# Patient Record
Sex: Male | Born: 1990 | Race: Black or African American | Hispanic: No | Marital: Single | Smoking: Never smoker
Health system: Southern US, Community
[De-identification: ages and names within clinical notes are randomized; demographics above are authoritative.]

## PROBLEM LIST (undated history)

## (undated) DIAGNOSIS — F32A Depression, unspecified: Secondary | ICD-10-CM

## (undated) DIAGNOSIS — F419 Anxiety disorder, unspecified: Secondary | ICD-10-CM

## (undated) HISTORY — DX: Depression, unspecified: F32.A

## (undated) HISTORY — DX: Anxiety disorder, unspecified: F41.9

## (undated) HISTORY — PX: WISDOM TOOTH EXTRACTION: SHX21

---

## 2013-08-11 ENCOUNTER — Encounter (HOSPITAL_COMMUNITY): Payer: Self-pay | Admitting: Emergency Medicine

## 2013-08-11 ENCOUNTER — Other Ambulatory Visit (HOSPITAL_COMMUNITY)
Admission: RE | Admit: 2013-08-11 | Discharge: 2013-08-11 | Disposition: A | Discharge status: Home / Self Care | Payer: Managed Care, Other (non HMO) | Source: Ambulatory Visit | Attending: Family Medicine | Admitting: Family Medicine

## 2013-08-11 ENCOUNTER — Emergency Department (INDEPENDENT_AMBULATORY_CARE_PROVIDER_SITE_OTHER)
Admission: EM | Admit: 2013-08-11 | Discharge: 2013-08-11 | Disposition: A | Discharge status: Home / Self Care | Payer: Managed Care, Other (non HMO) | Source: Home / Self Care | Attending: Family Medicine | Admitting: Family Medicine

## 2013-08-11 DIAGNOSIS — Z113 Encounter for screening for infections with a predominantly sexual mode of transmission: Secondary | ICD-10-CM | POA: Insufficient documentation

## 2013-08-11 DIAGNOSIS — J029 Acute pharyngitis, unspecified: Secondary | ICD-10-CM

## 2013-08-11 LAB — RPR

## 2013-08-11 LAB — HIV ANTIBODY (ROUTINE TESTING W REFLEX): HIV: NONREACTIVE

## 2013-08-11 LAB — POCT RAPID STREP A: STREPTOCOCCUS, GROUP A SCREEN (DIRECT): NEGATIVE

## 2013-08-11 MED ORDER — PREDNISONE 10 MG PO TABS: 30.0000 mg | ORAL_TABLET | Freq: Every day | ORAL | Status: DC

## 2013-08-11 MED ORDER — IPRATROPIUM BROMIDE 0.06 % NA SOLN: 2.0000 | Freq: Four times a day (QID) | NASAL | Status: DC

## 2013-08-11 NOTE — Discharge Instructions (Signed)
Thank you for coming in today. Take prednisone daily for 5 days. Use nasal spray to reduce postnasal drip. Call or go to the emergency room if you get worse, have trouble breathing, have chest pains, or palpitations.   Pharyngitis Pharyngitis is redness, pain, and swelling (inflammation) of your pharynx.  CAUSES  Pharyngitis is usually caused by infection. Most of the time, these infections are from viruses (viral) and are part of a cold. However, sometimes pharyngitis is caused by bacteria (bacterial). Pharyngitis can also be caused by allergies. Viral pharyngitis may be spread from person to person by coughing, sneezing, and personal items or utensils (cups, forks, spoons, toothbrushes). Bacterial pharyngitis may be spread from person to person by more intimate contact, such as kissing.  SIGNS AND SYMPTOMS  Symptoms of pharyngitis include:   Sore throat.   Tiredness (fatigue).   Low-grade fever.   Headache.  Joint pain and muscle aches.  Skin rashes.  Swollen lymph nodes.  Plaque-like film on throat or tonsils (often seen with bacterial pharyngitis). DIAGNOSIS  Your health care provider will ask you questions about your illness and your symptoms. Your medical history, along with a physical exam, is often all that is needed to diagnose pharyngitis. Sometimes, a rapid strep test is done. Other lab tests may also be done, depending on the suspected cause.  TREATMENT  Viral pharyngitis will usually get better in 3-4 days without the use of medicine. Bacterial pharyngitis is treated with medicines that kill germs (antibiotics).  HOME CARE INSTRUCTIONS   Drink enough water and fluids to keep your urine clear or pale yellow.   Only take over-the-counter or prescription medicines as directed by your health care provider:   If you are prescribed antibiotics, make sure you finish them even if you start to feel better.   Do not take aspirin.   Get lots of rest.   Gargle  with 8 oz of salt water ( tsp of salt per 1 qt of water) as often as every 1-2 hours to soothe your throat.   Throat lozenges (if you are not at risk for choking) or sprays may be used to soothe your throat. SEEK MEDICAL CARE IF:   You have large, tender lumps in your neck.  You have a rash.  You cough up green, yellow-brown, or bloody spit. SEEK IMMEDIATE MEDICAL CARE IF:   Your neck becomes stiff.  You drool or are unable to swallow liquids.  You vomit or are unable to keep medicines or liquids down.  You have severe pain that does not go away with the use of recommended medicines.  You have trouble breathing (not caused by a stuffy nose). MAKE SURE YOU:   Understand these instructions.  Will watch your condition.  Will get help right away if you are not doing well or get worse. Document Released: 01/29/2005 Document Revised: 11/19/2012 Document Reviewed: 10/06/2012 [facility] Patient Information 2015 [place], [place]. This information is not intended to replace advice given to you by your health care provider. Make sure you discuss any questions you have with your health care provider.

## 2013-08-11 NOTE — ED Notes (Signed)
C/o  Sore throat since Sunday.  Post nasal drip.  Swelling of left side of neck.  Denies fever.  Mild relief with ibuprofen.

## 2013-08-11 NOTE — ED Provider Notes (Signed)
[name] is a 23 y.o. male who presents to Urgent Care today for sore throat. Patient has a two-day history of sore throat with postnasal drip. Denies any cough congestion nausea vomiting or diarrhea. Ibuprofen does help a bit. No fevers or chills chest pains or palpitations. The pain is mild.   History reviewed. No pertinent past medical history. History  Substance Use Topics  . Smoking status: Never Smoker   . Smokeless tobacco: Not on file  . Alcohol Use: Yes   ROS as above Medications: No current facility-administered medications for this encounter.   Current Outpatient Prescriptions  Medication Sig Dispense Refill  . ipratropium (ATROVENT) 0.06 % nasal spray Place 2 sprays into both nostrils 4 (four) times daily.  15 mL  1  . predniSONE (DELTASONE) 10 MG tablet Take 3 tablets (30 mg total) by mouth daily.  15 tablet  0    Exam:  BP 153/83  Pulse 52  Temp(Src) 98.1 F (36.7 C) (Oral)  Resp 16  SpO2 100% Gen: Well NAD HEENT: EOMI,  MMM posterior pharynx is mildly erythematous. There is cobblestoning as well. Tympanic membranes are occluded by cerumen bilaterally. Lungs: Normal work of breathing. CTABL Heart: RRR no MRG Abd: NABS, Soft. NT, ND Exts: Brisk capillary refill, warm and well perfused.   Results for orders placed during the hospital encounter of 08/11/13 (from the past 24 hour(s))  POCT RAPID STREP A ([facility] ONLY)     Status: None   Collection Time    08/11/13 11:17 AM      Result Value Ref Range   Streptococcus, Group A Screen (Direct) NEGATIVE  NEGATIVE   No results found.  Assessment and Plan: 23 y.o. male with pharyngitis. Plan she was prednisone and Atrovent nasal spray. Throat culture pending.  Discussed warning signs or symptoms. Please see discharge instructions. Patient expresses understanding.    Rodolph BongEvan S Corey, MD 08/11/13 1145

## 2013-08-13 LAB — CULTURE, GROUP A STREP

## 2013-10-14 ENCOUNTER — Ambulatory Visit (INDEPENDENT_AMBULATORY_CARE_PROVIDER_SITE_OTHER): Payer: Managed Care, Other (non HMO) | Admitting: Family Medicine

## 2013-10-14 VITALS — BP 130/84 | HR 51 | Temp 98.0°F | Resp 18 | Ht 71.5 in | Wt 209.8 lb

## 2013-10-14 DIAGNOSIS — S61209A Unspecified open wound of unspecified finger without damage to nail, initial encounter: Secondary | ICD-10-CM

## 2013-10-14 DIAGNOSIS — S61219A Laceration without foreign body of unspecified finger without damage to nail, initial encounter: Secondary | ICD-10-CM

## 2013-10-14 DIAGNOSIS — M79645 Pain in left finger(s): Secondary | ICD-10-CM

## 2013-10-14 DIAGNOSIS — M79609 Pain in unspecified limb: Secondary | ICD-10-CM

## 2013-10-14 NOTE — Progress Notes (Signed)
[facility] [address], [place] [address] [phone]  Date:  10/14/2013   Name:  [name]   DOB:  01-22-1991   MRN:  [id number]  PCP:  No PCP Per Patient    Chief Complaint: Laceration   History of Present Illness:  [name] is a 23 y.o. very pleasant male patient who presents with the following:  He cut his left index finger on a can last night around 7:30 pm.  He is right handed.  He is generally healthy.  The wound is still open and bleeding some- he would like to have it repaired.  Last tetanus was in 2011.   No allergies No other issues today  There are no active problems to display for this patient.   History reviewed. No pertinent past medical history.  History reviewed. No pertinent past surgical history.  History  Substance Use Topics  . Smoking status: Never Smoker   . Smokeless tobacco: Not on file  . Alcohol Use: Yes    Family History  Problem Relation Age of Onset  . Hypertension Father     No Known Allergies  Medication list has been reviewed and updated.  No current outpatient prescriptions on file prior to visit.   No current facility-administered medications on file prior to visit.    Review of Systems:  As per HPI- otherwise negative.   Physical Examination: Filed Vitals:   10/14/13 1035  BP: 130/84  Pulse: 51  Temp: 98 F (36.7 C)  Resp: 18   Filed Vitals:   10/14/13 1035  Height: 5' 11.5" (1.816 m)  Weight: 209 lb 12.8 oz (95.165 kg)   Body mass index is 28.86 kg/(m^2). Ideal Body Weight: Weight in (lb) to have BMI = 25: 181.4  GEN: WDWN, NAD, Non-toxic, A & O x 3, looks well HEENT: Atraumatic, Normocephalic. Neck supple. No masses, No LAD. Ears and Nose: No external deformity. CV: RRR, No M/G/R. No JVD. No thrill. No extra heart sounds. PULM: CTA B, no wheezes, crackles, rhonchi. No retractions. No resp. distress. No accessory muscle use. EXTR: No c/c/e NEURO Normal gait.  PSYCH:  Normally interactive. Conversant. Not depressed or anxious appearing.  Calm demeanor.  Left hand: he has a laceration across the pad of the left index finger, distal phalanx.  It does not appear to be deep but is gaping open.  He has normal sensation distal to the laceration and normal flexion/ extension/ ab and adduction of the finger.  Nothing to suggest tendon damage   Reviewed database- he had a Tdap in 07/2009  See laceration repair note per Drucie Ip, PA-C Discussed with pt the fact that this closure is later than ideal as it have been over 12 hours. He understands this but would like to repair the wound as it is bleeding and a bother. However he will watch closely for any sign of infection- reviewed with him in detail.  He would like to proceed with repair   Assessment and Plan: Pain in finger of left hand  Laceration of finger, initial encounter  Follow-up for SR in 7-10 days.  Watch for any sign of infection.  Wound care discussed  Signed Abbe Amsterdam, MD

## 2013-10-14 NOTE — Patient Instructions (Signed)

## 2013-10-14 NOTE — Progress Notes (Signed)
Patient ID: [name] MRN: [id number], DOB: 26-Sep-1990, 23 y.o. Date of Encounter: 10/14/2013, 11:46 AM   PROCEDURE NOTE: Verbal consent obtained. Sterile technique employed. Numbing: Anesthesia obtained with 2% lidocaine plain.   Cleansed with soap and water. Irrigated.  Wound explored, no deep structures involved, no foreign bodies.   Wound repaired with # 7 SI sutures.  Hemostasis obtained. Wound cleansed and dressed.  Wound care instructions including precautions covered with patient. Handout given.  Anticipate suture removal in 7-10 days  Rhoderick Moody, PA-C 10/14/2013 11:46 AM

## 2013-10-21 ENCOUNTER — Ambulatory Visit (INDEPENDENT_AMBULATORY_CARE_PROVIDER_SITE_OTHER): Payer: Managed Care, Other (non HMO) | Admitting: Family Medicine

## 2013-10-21 VITALS — BP 124/88 | HR 52 | Temp 97.8°F | Resp 16 | Ht 71.75 in | Wt 209.0 lb

## 2013-10-21 DIAGNOSIS — Z4802 Encounter for removal of sutures: Secondary | ICD-10-CM

## 2013-10-21 NOTE — Progress Notes (Signed)
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 He recently cut his finger on a sharp can lid.  Objective: Nontender, well-healed suture line left index finger, distal phalanx. All 7 sutures removed  Assessment: Well-healed wound, problem is resolved, Signed, Sheila Oats.D.

## 2014-01-11 ENCOUNTER — Ambulatory Visit (INDEPENDENT_AMBULATORY_CARE_PROVIDER_SITE_OTHER): Payer: Managed Care, Other (non HMO) | Admitting: Emergency Medicine

## 2014-01-11 VITALS — BP 132/98 | HR 85 | Temp 98.0°F | Resp 18 | Ht 73.0 in | Wt 206.0 lb

## 2014-01-11 DIAGNOSIS — B950 Streptococcus, group A, as the cause of diseases classified elsewhere: Secondary | ICD-10-CM

## 2014-01-11 DIAGNOSIS — J029 Acute pharyngitis, unspecified: Secondary | ICD-10-CM

## 2014-01-11 LAB — POCT RAPID STREP A (OFFICE): Rapid Strep A Screen: POSITIVE — AB

## 2014-01-11 MED ORDER — AMOXICILLIN 875 MG PO TABS: 875.0000 mg | ORAL_TABLET | Freq: Two times a day (BID) | ORAL | Status: AC

## 2014-01-11 MED ORDER — IBUPROFEN 800 MG PO TABS: ORAL_TABLET | ORAL | Status: DC

## 2014-01-11 NOTE — Progress Notes (Signed)
IDENTIFYING INFORMATION  [name] / DOB: 11/07/1990 / MRN: [id number]  The patient  does not have a problem list on file.  SUBJECTIVE  Chief Complaint: Sore Throat and Otalgia   History of present illness: [name] is a well appearing 23 y.o. year old male with not pertinent medical history who presents with one week of sore throat.  He also complains of bilateral otalgia that comes and goes.  He reports that he is having some mild throat swelling and pain with touching his throat. He denies cough, congestion, HA, myalgia.  He denies sick contacts.    He denies new sexual partners in the last six months.  He is in a monogamous relationship with a girl for about 1 year.  He reports that she had been sick before him with a cold.    He denies a history of GERD, PUD, and kidney disease.    He has no past medical history and takes no medication.  [name] has No Known Allergies. He  reports that he has never smoked. He has never used smokeless tobacco. He reports that he drinks alcohol. He reports that he does not use illicit drugs. He  reports that he currently engages in sexual activity.  The patient  has no past surgical history on file.  His family history includes Hypertension in his father.  Review of Systems  Constitutional: Positive for chills and weight loss. Negative for fever and malaise/fatigue.       Night sweats last night.    HENT: Positive for ear pain. Negative for congestion.   Respiratory: Negative for cough.   Cardiovascular: Negative for chest pain.  Gastrointestinal: Negative for nausea, vomiting, diarrhea and constipation.  Genitourinary: Negative.   Musculoskeletal: Negative for myalgias.  Skin: Positive for itching. Negative for rash.  Neurological: Negative for dizziness, weakness and headaches.  Psychiatric/Behavioral: Negative for depression.    OBJECTIVE  Blood pressure 132/98, pulse 85, temperature 98 F (36.7 C), temperature source  Oral, resp. rate 18, height 6\' 1"  (1.854 m), weight 206 lb (93.441 kg), SpO2 99 %. The patient's body mass index is 27.18 kg/(m^2).  Physical Exam  Constitutional: He is oriented to person, place, and time. He appears well-developed and well-nourished. No distress.  HENT:  Right Ear: External ear normal.  Left Ear: External ear normal.  Nose: Nose normal.  Mouth/Throat: Uvula is midline. No oral lesions. No trismus in the jaw. Posterior oropharyngeal edema and posterior oropharyngeal erythema present. No oropharyngeal exudate or tonsillar abscesses.    Eyes: EOM are normal.  Neck: Normal range of motion.  Cardiovascular: Normal rate, regular rhythm and normal heart sounds.   Respiratory: Effort normal and breath sounds normal. He has no wheezes. He has no rales. He exhibits no tenderness.  GI: Soft. Bowel sounds are normal.  Musculoskeletal: Normal range of motion.  Lymphadenopathy:       Head (right side): Tonsillar adenopathy present.       Head (left side): Tonsillar adenopathy present.    He has no cervical adenopathy.  Neurological: He is alert and oriented to person, place, and time. No cranial nerve deficit.  Skin: Skin is warm and dry. He is not diaphoretic.  Psychiatric: He has a normal mood and affect. His behavior is normal. Judgment and thought content normal.    Results for orders placed or performed in visit on 01/11/14 (from the past 24 hour(s))  POCT rapid strep A     Status: Abnormal  Collection Time: 01/11/14 11:13 AM  Result Value Ref Range   Rapid Strep A Screen Positive (A) Negative    ASSESSMENT & PLAN  [name] was seen today for sore throat and otalgia.  Diagnoses and associated orders for this visit:  Sore throat - POCT rapid strep A -     Ibuprofen 800 mg po q8 prn  Group A streptococcal infection - amoxicillin (AMOXIL) 875 MG tablet; Take 1 tablet (875 mg total) by mouth 2 (two) times daily.   The patient was instructed to to call or comeback  to clinic as needed, or should symptoms warrant.  Deliah BostonMichael Clark, MHS, PA-C [facility] 01/11/2014 11:20 AM

## 2014-01-11 NOTE — Patient Instructions (Signed)
Strep Throat Tests °While most sore throats are caused by viruses, at times they are caused by a bacteria called group A Streptococci (strep throat). It is important to determine the cause because the strep bacteria is treated with antibiotic medication. °There are 2 types of tests for strep throat: a rapid strep test and a throat culture. Both tests are done by wiping a swab over the back of the throat and then using chemicals to identify the type of bacteria present. The rapid strep test takes 10 to 20 minutes. If the rapid strep test is negative, a throat culture may be performed to confirm the results. With a throat culture, the swab is used to spread the bacteria on a gel plate and grow it in a lab, which may take 1 to 2 days. In some cases, the culture will detect strep bacteria not found with the rapid strep test. If the result of the rapid strep test is positive, no further testing is needed, and your caregiver will prescribe antibiotics. °Not all test results are available during your visit. If your test results are not back during the visit, make an appointment with your caregiver to find out the results. Do not assume everything is normal if you have not heard from your caregiver or the medical facility. It is important for you to follow up on all of your test results. °SEEK MEDICAL CARE IF:  °· Your symptoms are not improving within 1 to 2 days, or you are getting worse. °· You have any other questions or concerns. °SEEK IMMEDIATE MEDICAL CARE IF:  °· You have increased difficulty with swallowing. °· You develop trouble breathing. °· You have a fever. °Document Released: 03/08/2004 Document Revised: 04/23/2011 Document Reviewed: 05/06/2013 °ExitCare® Patient Information ©2015 Exit[employer], LLC. This information is not intended to replace advice given to you by your health care provider. Make sure you discuss any questions you have with your health care provider. ° °

## 2014-03-27 ENCOUNTER — Ambulatory Visit (INDEPENDENT_AMBULATORY_CARE_PROVIDER_SITE_OTHER): Payer: Managed Care, Other (non HMO)

## 2014-03-27 ENCOUNTER — Ambulatory Visit (INDEPENDENT_AMBULATORY_CARE_PROVIDER_SITE_OTHER): Payer: Managed Care, Other (non HMO) | Admitting: Family Medicine

## 2014-03-27 VITALS — BP 122/76 | HR 57 | Temp 98.0°F | Ht 72.0 in | Wt 215.0 lb

## 2014-03-27 DIAGNOSIS — M25562 Pain in left knee: Secondary | ICD-10-CM

## 2014-03-27 DIAGNOSIS — S8982XS Other specified injuries of left lower leg, sequela: Secondary | ICD-10-CM | POA: Diagnosis not present

## 2014-03-27 DIAGNOSIS — M25551 Pain in right hip: Secondary | ICD-10-CM | POA: Diagnosis not present

## 2014-03-27 DIAGNOSIS — R1030 Lower abdominal pain, unspecified: Secondary | ICD-10-CM

## 2014-03-27 DIAGNOSIS — B356 Tinea cruris: Secondary | ICD-10-CM

## 2014-03-27 DIAGNOSIS — S838X2S Sprain of other specified parts of left knee, sequela: Secondary | ICD-10-CM

## 2014-03-27 DIAGNOSIS — Z113 Encounter for screening for infections with a predominantly sexual mode of transmission: Secondary | ICD-10-CM | POA: Diagnosis not present

## 2014-03-27 DIAGNOSIS — R1031 Right lower quadrant pain: Secondary | ICD-10-CM

## 2014-03-27 LAB — RPR

## 2014-03-27 LAB — HIV ANTIBODY (ROUTINE TESTING W REFLEX): HIV: NONREACTIVE

## 2014-03-27 MED ORDER — MICONAZOLE NITRATE 2 % POWD: Status: DC

## 2014-03-27 MED ORDER — MELOXICAM 15 MG PO TABS: 15.0000 mg | ORAL_TABLET | Freq: Every day | ORAL | Status: DC
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
## 2014-03-27 NOTE — Patient Instructions (Signed)
Meniscus Tear with Phase II Rehab The meniscus is a C-shaped cartilage structure, located in the knee joint between the thigh bone (femur) and the shinbone (tibia). Two menisci are located in each knee joint: the inner and outer meniscus. The meniscus acts as an adapter between the thigh bone and shinbone, allowing them to fit properly together. It also functions as a shock absorber, to reduce the stress placed on the knee joint and to help supply nutrients to the knee joint cartilage. As people age, the meniscus begins to harden and become more vulnerable to injury. Meniscus tears are a common injury, especially in older athletes. Inner meniscus tears are more common than outer meniscus tears.  SYMPTOMS   Pain in the knee, especially with standing or squatting with the affected leg.  Tenderness along the joint line.  Swelling in the knee joint (effusion), usually starting 1 to 2 days after injury.  Locking or catching of the knee joint, causing inability to straighten the knee completely.  Giving way or buckling of the knee. CAUSES  A meniscus tear occurs when a force is placed on the meniscus that is greater than it can handle. Common causes of injury include:  Direct hit (trauma) to the knee.  Twisting, pivoting, or cutting (rapidly changing direction while running), kneeling or squatting.  Without injury, due to aging. RISK INCREASES WITH:  Contact sports (football, rugby).  Sports in which cleats are used with pivoting (soccer, lacrosse) or sports in which good shoe grip and sudden change in direction are required (racquetball, basketball, squash).  Previous knee injury.  Associated knee injury, particularly ligament injuries.  Poor strength and flexibility. PREVENTION  Warm up and stretch properly before activity.  Maintain physical fitness:  Strength, flexibility, and endurance.  Cardiovascular fitness.  Protect the knee with a brace or elastic bandage.  Wear  properly fitted protective equipment (proper cleats for the surface). PROGNOSIS  Sometimes, meniscus tears heal on their own. However, definitive treatment requires surgery, followed by at least 6 weeks of recovery.  RELATED COMPLICATIONS   Recurring symptoms that result in a chronic problem.  Repeated knee injury, especially if sports are resumed too soon after injury or surgery.  Progression of the tear (the tear gets larger), if untreated.  Arthritis of the knee in later years (with or without surgery).  Complications of surgery, including infection, bleeding, injury to nerves (numbness, weakness, paralysis) continued pain, giving way, locking, nonhealing of meniscus (if repaired), need for further surgery, and knee stiffness (loss of motion). TREATMENT  Treatment first involves the use of ice and medicine, to reduce pain and inflammation. You may find using crutches to walk more comfortable. However, it is okay to bear weight on the injured knee, if the pain will allow it. Surgery is often advised as a definitive treatment. Surgery is performed through an incision near the joint (arthroscopically). The torn piece of the meniscus is removed, and if possible the joint cartilage is repaired. After surgery, the joint must be restrained. After restraint, it is important to perform strengthening and stretching exercises to help regain strength and a full range of motion. These exercises may be completed at home or with a therapist.  MEDICATION   If pain medicine is needed, nonsteroidal anti-inflammatory medicines (aspirin and ibuprofen), or other minor pain relievers (acetaminophen), are often advised.  Do not take pain medicine for 7 days before surgery.  Prescription pain relievers may be given, if your caregiver thinks they are needed. Use only as directed  and only as much as you need. HEAT AND COLD:  Cold treatment (icing) should be applied for 10 to 15 minutes every 2 to 3 hours for  inflammation and pain, and immediately after activity that aggravates your symptoms. Use ice packs or an ice massage.  Heat treatment may be used before performing stretching and strengthening activities prescribed by your caregiver, physical therapist, or athletic trainer. Use a heat pack or a warm water soak. SEEK MEDICAL CARE IF:  Symptoms get worse or do not improve in 2 weeks, despite treatment.  New, unexplained symptoms develop. (Drugs used in treatment may produce side effects.) EXERCISES RANGE OF MOTION (ROM) AND STRETCHING EXERCISES - Meniscus Tear, Non-operative Phase II After your physician, physical therapist or athletic trainer feels your knee has made progress significant enough to begin more advanced exercises, he or she may recommend some of the exercises that follow. He or she may also advise you to continue with the exercises which you completed in Phase I of your rehabilitation. While completing these exercises, remember:   Restoring tissue flexibility helps normal motion to return to the joints. This allows healthier, less painful movement and activity.  An effective stretch should be held for at least 30 seconds.  A stretch should never be painful. You should only feel a gentle lengthening or release in the stretched tissue. STRETCH - Quadriceps, Prone   Lie on your stomach on a firm surface, such as a bed or padded floor.  Bend your right / left knee and grasp your ankle. If you are unable to reach your ankle or pant leg, use a belt around your foot to lengthen your reach.  Gently pull your heel toward your buttocks. Your knee should not slide out to the side. You should feel a stretch in the front of your thigh and knee.  Hold this position for __________ seconds. Repeat __________ times. Complete this stretch __________ times per day.  STRETCH - Knee Extension, Prone  Lie on your stomach on a firm surface, such as a bed or countertop. Place your right / left knee  and leg just beyond the edge of the surface. You may wish to place a towel under the far end of your right / left thigh for comfort.  Relax your leg muscles and allow gravity to straighten your knee. Your caregiver may advise you to add an ankle weight, if more resistance is helpful for you.  You should feel a stretch in the back of your right / left knee. Hold this position for __________ seconds. Repeat __________ times. Complete this __________ times per day. STRENGTHENING EXERCISES - Meniscus Tear Phase II These are some of the exercises you may progress to in your rehabilitation program. It is critical that you follow the instructions of your caregiver. Based on your individual needs, your caregiver may choose a more or less aggressive approach than the exercises presented. Remember:   Strong muscles with good endurance tolerate stress better.  Do the exercises as initially prescribed by your caregiver. Progress slowly with each exercise, gradually increasing the number of repetitions and weight used under his or her guidance. STRENGTH - Quadriceps, Short Arcs   Lie on your back. Place a __________ inch towel roll under your right / left knee, so that the knee bends slightly.  Raise only your lower leg by tightening the muscles in the front of your thigh. Do not allow your thigh to rise.  Hold this position for __________ seconds. Repeat __________ times.  Complete this exercise __________ times per day.  OPTIONAL ANKLE WEIGHTS: Begin with ____________________, but DO NOT exceed ____________________. Increase in 1 pound/0.5 kilogram increments. STRENGTH - Quadriceps, Step-Ups   Use a thick book, step or step stool that is __________ inches tall.  Hold a wall or counter for balance only, not support.  Slowly step up with your right / left foot, keeping your knee in line with your hip and foot. Do not allow your knee to bend so far that you cannot see your toes.  Slowly unlock your  knee and lower yourself to the starting position. Your muscles, not gravity, should lower you. Repeat __________ times. Complete this exercise __________ times per day.  STRENGTH - Quadriceps, Wall Slides  Follow guidelines for form closely. Increased knee pain often results from poorly placed feet or knees.  Lean against a smooth wall or door and walk your feet out 18-24 inches. Place your feet hip width apart.  Slowly slide down the wall or door until your knees bend __________ degrees.* Keep your knees over your heels, not your toes, and in line with your hips, not falling to either side.  Hold for __________ seconds. Stand up to rest for __________ seconds in between each repetition. Repeat __________ times. Complete this exercise __________ times per day. * Your physician, physical therapist or athletic trainer will alter this angle based on your symptoms and progress. STRENGTH - Hamstring, Curls  Lay on your stomach with your legs extended. (If you lay on a bed, your feet may hang over the edge.)  Tighten the muscles in the back of your thigh to bend your right / left knee up to 90 degrees. Keep your hips flat on the bed.  Hold this position for __________ seconds.  Slowly lower your leg back to the starting position. Repeat __________ times. Complete this exercise __________ times per day.  OPTIONAL ANKLE WEIGHTS: Begin with ____________________, but DO NOT exceed ____________________. Increase in 1 pound/0.5 kilogram increments. Document Released: 05/22/2005 Document Revised: 04/23/2011 Document Reviewed: 05/13/2008 [facility] Patient Information 2015 [place], [place]. This information is not intended to replace advice given to you by your health care provider. Make sure you discuss any questions you have with your health care provider.    Iliotibial Band Syndrome with Rehab The iliotibial (IT) band is a tendon that connects the hip muscles to the shinbone (tibia) and to one of the  bones of the pelvis (ileum). The IT band passes by the knee and is often irritated by the outer portion of the knee (lateral femoral condyle). A fluid filled sac (bursa) exists between the tendon and the bone, to cushion and reduce friction. Overuse of the tendon may cause excessive friction, which results in IT band syndrome. This condition involves inflammation of the bursa (bursitis) and/or inflammation of the IT band (tendinitis). SYMPTOMS   Pain, tenderness, swelling, warmth, or redness over the IT band, at the outer knee (above the joint).  Pain that travels up or down the thigh or leg.  Initially, pain at the beginning of an exercise, that decreases once warmed up. Eventually, pain throughout the activity, getting worse as the activity continues. May cause the athlete to stop in the middle of training or competing.  Pain that gets worse when running down hills or stairs, on banked tracks, or next to the curb on the street.  Pain that increases when the foot of the affected leg hits the ground.  Possibly, a crackling sound (crepitation) when the  tendon or bursa is moved or touched. CAUSES  IT band syndrome is caused by irritation of the IT band and the underlying bursa. This eventually results in inflammation and pain. IT band syndrome is an overuse injury.  RISK INCREASES WITH:  Sports with repetitive knee-bending activities (distance running, cycling).  Incorrect training techniques, including sudden changes in the intensity, frequency, or duration of training.  Not enough rest between workouts.  Poor strength and flexibility, especially a tight IT band.  Failure to warm up properly before activity.  Bow legs.  Arthritis of the knee. PREVENTION   Warm up and stretch properly before activity.  Allow for adequate recovery between workouts.  Maintain physical fitness:  Strength, flexibility, and endurance.  Cardiovascular fitness.  Learn and use proper training  technique, including reducing running mileage, shortening stride, and avoiding running on hills and banked surfaces.  Wear arch supports (orthotics), if you have flat feet. PROGNOSIS  If treated properly, IT band syndrome usually goes away within 6 weeks of treatment. RELATED COMPLICATIONS   Longer healing time, if not properly treated, or if not given enough time to heal.  Recurring inflammation of the tendon and bursa, that may result in a chronic condition.  Recurring symptoms, if activity is resumed too soon, with overuse, with a direct blow, or with poor training technique.  Inability to complete training or competition. TREATMENT  Treatment first involves the use of ice and medicine, to reduce pain and inflammation. The use of strengthening and stretching exercises may help reduce pain with activity. These exercises may be performed at home or with a therapist. For individuals with flat feet, an arch support (orthotic) may be helpful. Some individuals find that wearing a knee sleeve or compression bandage around the knee during workouts provides some relief. Certain training techniques, such as adjusting stride length, avoiding running on hills or stairs, changing the direction you run on a circular or banked track, or changing the side of the road you run on, if you run next to the curb, may help decrease symptoms of IT band syndrome. Cyclists may need to change the seat height or foot position on their bicycles. An injection of cortisone into the bursa may be recommended. Surgery to remove the inflamed bursa and/or part of the IT band is only considered after at least 6 months of non-surgical treatment.  MEDICATION   If pain medicine is needed, nonsteroidal anti-inflammatory medicines (aspirin and ibuprofen), or other minor pain relievers (acetaminophen), are often advised.  Do not take pain medicine for 7 days before surgery.  Prescription pain relievers may be given, if your caregiver  thinks they are needed. Use only as directed and only as much as you need.  Corticosteroid injections may be given by your caregiver. These injections should be reserved for the most serious cases, because they may only be given a certain number of times. HEAT AND COLD  Cold treatment (icing) should be applied for 10 to 15 minutes every 2 to 3 hours for inflammation and pain, and immediately after activity that aggravates your symptoms. Use ice packs or an ice massage.  Heat treatment may be used before performing stretching and strengthening activities prescribed by your caregiver, physical therapist, or athletic trainer. Use a heat pack or a warm water soak. SEEK MEDICAL CARE IF:   Symptoms get worse or do not improve in 2 to 4 weeks, despite treatment.  New, unexplained symptoms develop. (Drugs used in treatment may produce side effects.) EXERCISES  RANGE  OF MOTION (ROM) AND STRETCHING EXERCISES - Iliotibial Band Syndrome These exercises may help you when beginning to rehabilitate your injury. Your symptoms may go away with or without further involvement from your physician, physical therapist or athletic trainer. While completing these exercises, remember:   Restoring tissue flexibility helps normal motion to return to the joints. This allows healthier, less painful movement and activity.  An effective stretch should be held for at least 30 seconds.  A stretch should never be painful. You should only feel a gentle lengthening or release in the stretched tissue. STRETCH - Quadriceps, Prone   Lie on your stomach on a firm surface, such as a bed or padded floor.  Bend your right / left knee and grasp your ankle. If you are unable to reach your ankle or pant leg, use a belt around your foot to lengthen your reach.  Gently pull your heel toward your buttocks. Your knee should not slide out to the side. You should feel a stretch in the front of your thigh and knee.  Hold this position for  __________ seconds. Repeat __________ times. Complete this stretch __________ times per day.  STRETCH - Iliotibial Band  On the floor or bed, lie on your side, so your right / left leg is on top. Bend your knee and grab your ankle.  Slowly bring your knee back so that your thigh is in line with your trunk. Keep your heel at your buttocks and gently arch your back, so your head, shoulders and hips line up.  Slowly lower your leg so that your knee approaches the floor or bed, until you feel a gentle stretch on the outside of your right / left thigh. If you do not feel a stretch and your knee will not fall farther, place the heel of your opposite foot on top of your knee, and pull your thigh down farther.  Hold this stretch for __________ seconds. Repeat __________ times. Complete this stretch __________ times per day. STRENGTHENING EXERCISES - Iliotibial Band Syndrome Improving the flexibility of the IT band will best relieve your discomfort due to IT band syndrome. Strengthening exercises, however, can help improve both muscle endurance and joint mechanics, reducing the factors that can contribute to this condition. Your physician, physical therapist or athletic trainer may provide you with exercises that train specific muscle groups that are especially weak. The following exercises target muscles that are often weak in people who have IT band syndrome. STRENGTH - Hip Abductors, Straight Leg Raises  Be aware of your form throughout the entire exercise, so that you exercise the correct muscles. Poor form means that you are not strengthening the correct muscles.  Lie on your side, so that your head, shoulders, knee and hip line up. You may bend your lower knee to help maintain your balance. Your right / left leg should be on top.  Roll your hips slightly forward, so that your hips are stacked directly over each other and your right / left knee is facing forward.  Lift your top leg up 4-6 inches,  leading with your heel. Be sure that your foot does not drift forward and that your knee does not roll toward the ceiling.  Hold this position for __________ seconds. You should feel the muscles in your outer hip lifting (you may not notice this until your leg begins to tire).  Slowly lower your leg to the starting position. Allow the muscles to fully relax before beginning the next repetition. Repeat __________  times. Complete this exercise __________ times per day.  STRENGTH - Quad/VMO, Isometric  Sit in a chair with your right / left knee slightly bent. With your fingertips, feel the VMO muscle (just above the inside of your knee). The VMO is important in controlling the position of your kneecap.  Keeping your fingertips on this muscle. Without actually moving your leg, attempt to drive your knee down, as if straightening your leg. You should feel your VMO tense. If you have a difficult time, you may wish to try the same exercise on your healthy knee first.  Tense this muscle as hard as you can, without increasing any knee pain.  Hold for __________ seconds. Relax the muscles slowly and completely between each repetition. Repeat __________ times. Complete this exercise __________ times per day.  Document Released: 01/29/2005 Document Revised: 04/23/2011 Document Reviewed: 05/13/2008 [facility] Patient Information 2015 [place], [place]. This information is not intended to replace advice given to you by your health care provider. Make sure you discuss any questions you have with your health care provider.

## 2014-03-28 ENCOUNTER — Other Ambulatory Visit: Payer: Self-pay | Admitting: Family Medicine

## 2014-03-28 DIAGNOSIS — M25562 Pain in left knee: Secondary | ICD-10-CM

## 2014-03-28 DIAGNOSIS — M25551 Pain in right hip: Secondary | ICD-10-CM

## 2014-03-29 LAB — HSV(HERPES SIMPLEX VRS) I + II AB-IGG
HSV 1 Glycoprotein G Ab, IgG: <0.1 IV
HSV 2 Glycoprotein G Ab, IgG: <0.1 IV

## 2014-04-01 ENCOUNTER — Telehealth: Payer: Self-pay

## 2014-04-01 NOTE — Telephone Encounter (Signed)
Trichomonas test was cancelled by [facility], although I am unsure why because they say they have a urine and that the uriprobe is pending. I am investigating it further. Do you want me to have pt RTC for recollect?

## 2014-04-02 LAB — TRICHOMONAS VAGINALIS, PROBE AMP: TRICHOMONAS VAGINALIS PROBE APTIMA: NEGATIVE

## 2014-04-02 LAB — GC/CHLAMYDIA PROBE AMP
CT Probe RNA: NEGATIVE
GC PROBE AMP APTIMA: NEGATIVE

## 2014-04-02 NOTE — Telephone Encounter (Signed)
Spoke with Viki (our [facility] rep). She is going to investigate this further. In the meantime, they are going to go ahead and run the Trich test with a disclaimer.

## 2014-04-02 NOTE — Telephone Encounter (Signed)
That's fine. Pt had no exposures or concerns - just a routine std test so no need to have pt rtc for recheck at this time - i don't know if he even know that i ran a trich test so will just let him know that he may want to have this rechecked at his next routine OV.

## 2014-04-29 ENCOUNTER — Other Ambulatory Visit: Payer: Self-pay | Admitting: Orthopedic Surgery

## 2014-04-29 DIAGNOSIS — M25551 Pain in right hip: Secondary | ICD-10-CM

## 2014-04-30 ENCOUNTER — Encounter: Payer: Self-pay | Admitting: Family Medicine

## 2014-05-13 ENCOUNTER — Ambulatory Visit
Admission: RE | Admit: 2014-05-13 | Discharge: 2014-05-13 | Disposition: A | Discharge status: Home / Self Care | Payer: Managed Care, Other (non HMO) | Source: Ambulatory Visit | Attending: Orthopedic Surgery | Admitting: Orthopedic Surgery

## 2014-05-13 ENCOUNTER — Other Ambulatory Visit: Payer: Self-pay | Admitting: Orthopedic Surgery

## 2014-05-13 DIAGNOSIS — M25551 Pain in right hip: Secondary | ICD-10-CM

## 2014-05-28 ENCOUNTER — Ambulatory Visit
Admission: RE | Admit: 2014-05-28 | Discharge: 2014-05-28 | Disposition: A | Discharge status: Home / Self Care | Payer: Managed Care, Other (non HMO) | Source: Ambulatory Visit | Attending: Orthopedic Surgery | Admitting: Orthopedic Surgery

## 2014-05-28 DIAGNOSIS — M25551 Pain in right hip: Secondary | ICD-10-CM

## 2014-05-28 MED ORDER — IOHEXOL 180 MG/ML  SOLN
15.0000 mL | Freq: Once | INTRAMUSCULAR | Status: AC | PRN
  Administered 2014-05-28: 15 mL via INTRA_ARTICULAR

## 2016-08-01 IMAGING — MR MR HIP*R* W/CM
4 of 5 series · 20 of 40 positions shown · IV contrast (agent unspecified)
Comparison: None.

CLINICAL DATA: Anterior right groin pain and particularly when
rotating the right leg out. Symptoms for 1 year. No known injury.

EXAM:
MRI ARTHROGRAM OF THE RIGHT HIP WITH CONTRAST
TECHNIQUE: Multiplanar, multisequence MR imaging was performed following the
administration of intra-articular contrast.
CONTRAST:  Please see report of injection.

[Series 2: T1 · coronal · 4.0mm · 0.35mm/px · 8 of 20 slices shown (1 of 3)]
[im 1/20]
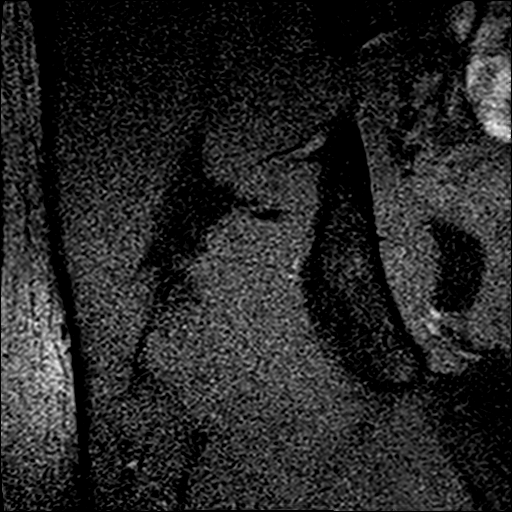
[im 3/20]
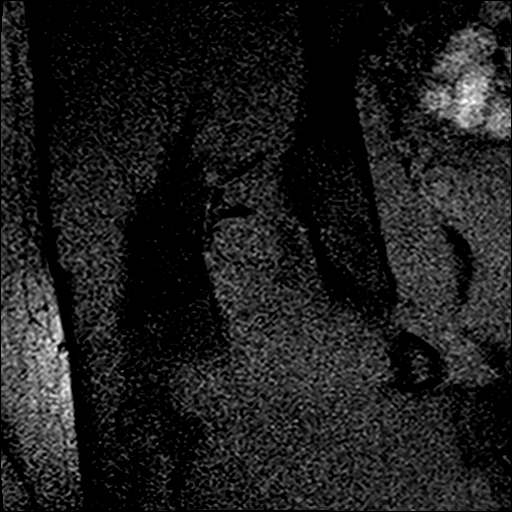
[im 6/20]
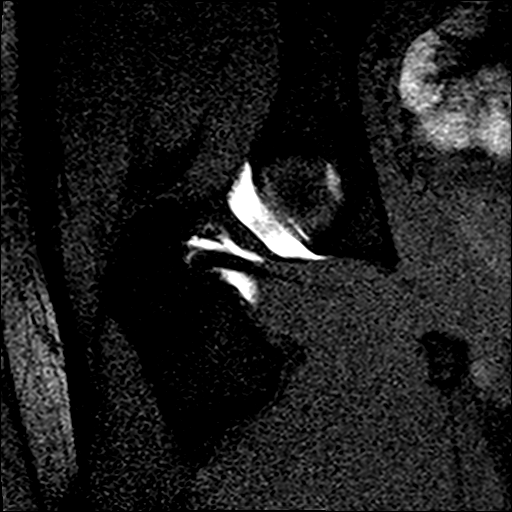
[im 9/20]
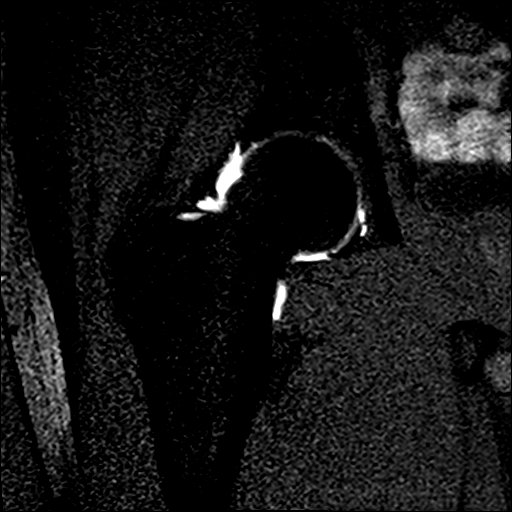
[im 11/20]
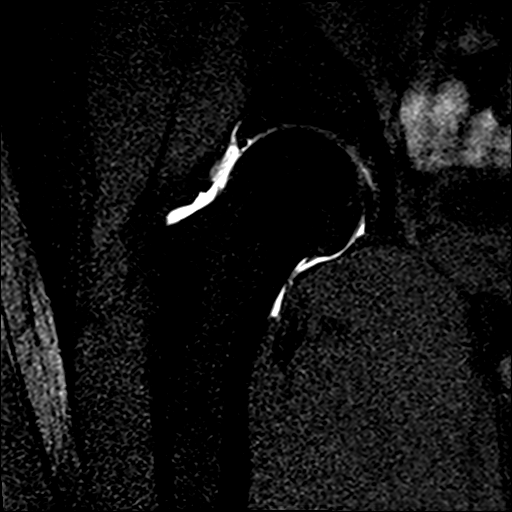
[im 14/20]
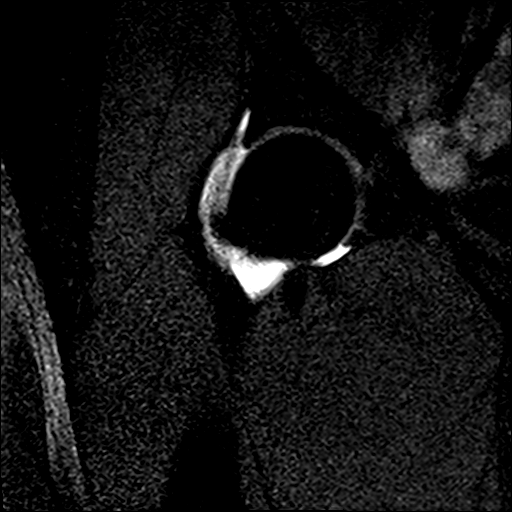
[im 17/20]
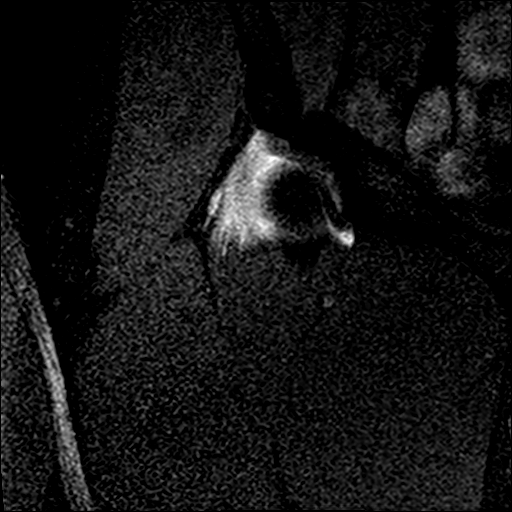
[im 20/20]
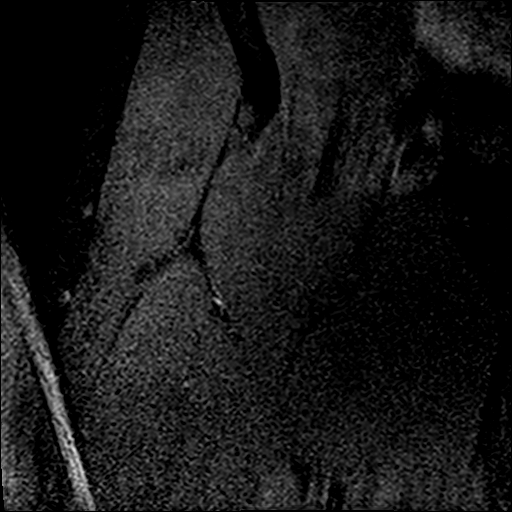

[Series 3: T2 · coronal · 4.0mm · 0.70mm/px · 6 of 18 slices shown]
[im 1/18]
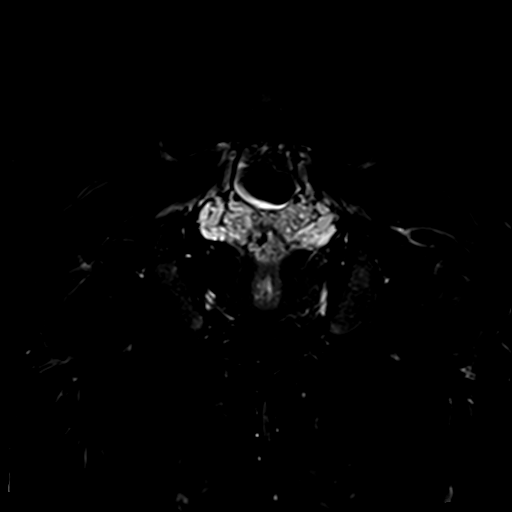
[im 3/18]
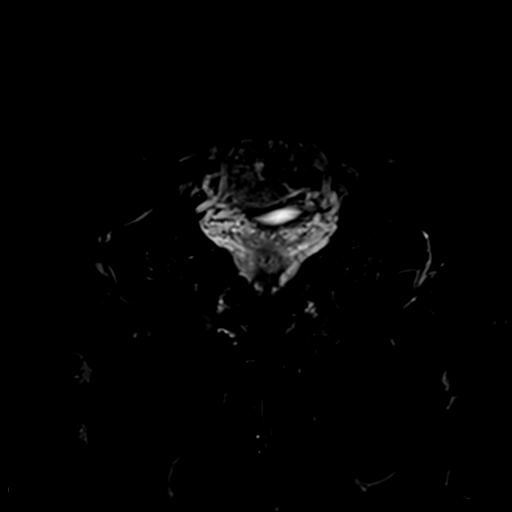
[im 6/18]
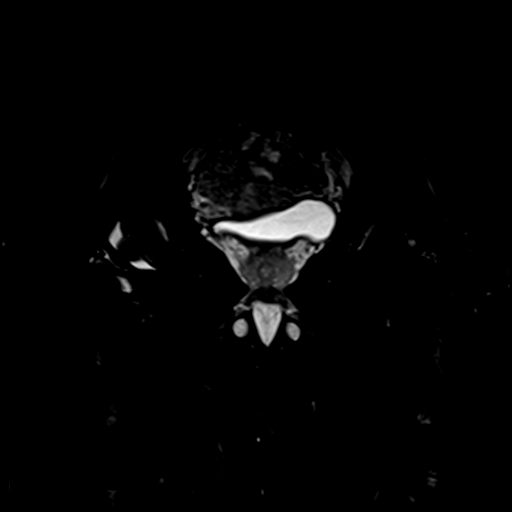
[im 9/18]
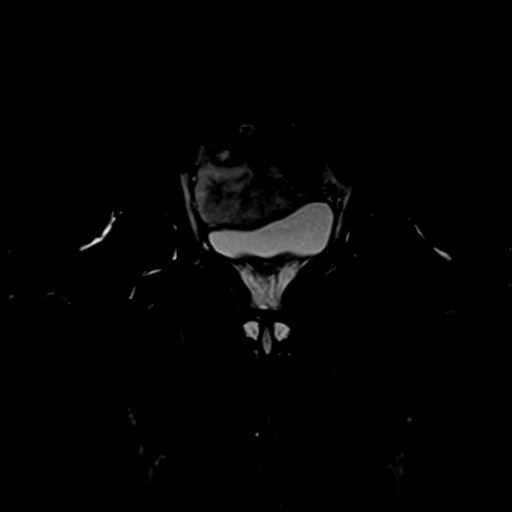
[im 12/18]
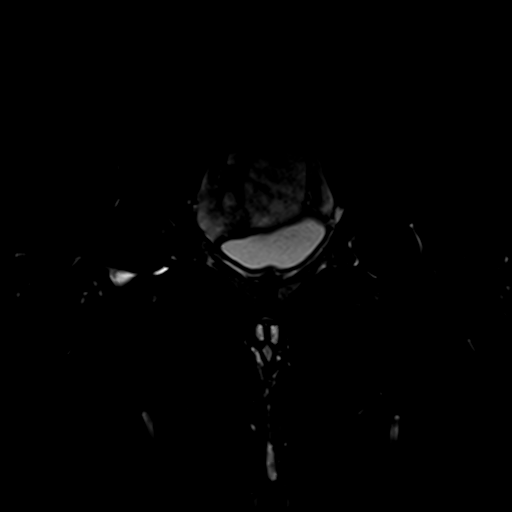
[im 15/18]
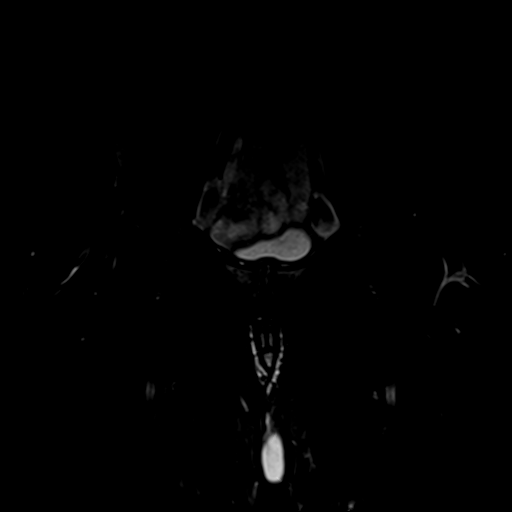

[Series 4: T1 · axial · 4.0mm · 0.35mm/px · z∈[+15,+71]mm · 3 of 20 slices shown (2 of 3)]
[im 3/20]
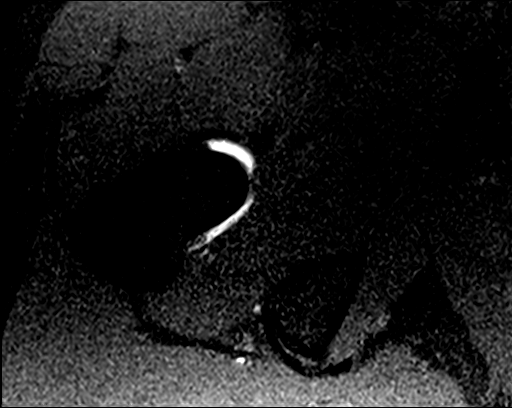
[im 11/20]
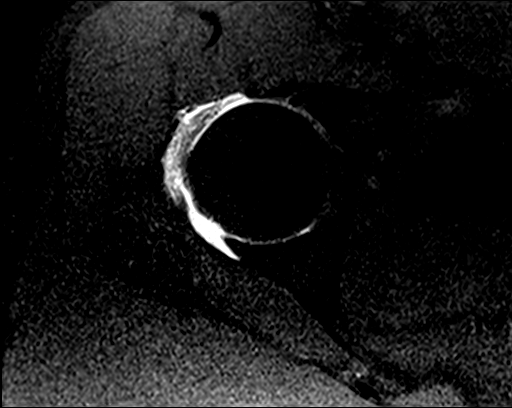
[im 17/20]
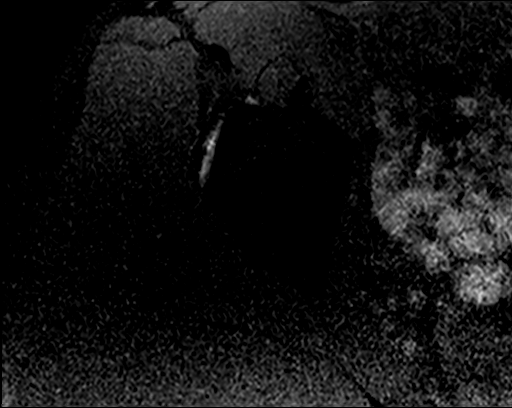

[Series 5: T1 · coronal · 4.0mm · 0.70mm/px · 3 of 18 slices shown (3 of 3)]
[im 3/18]
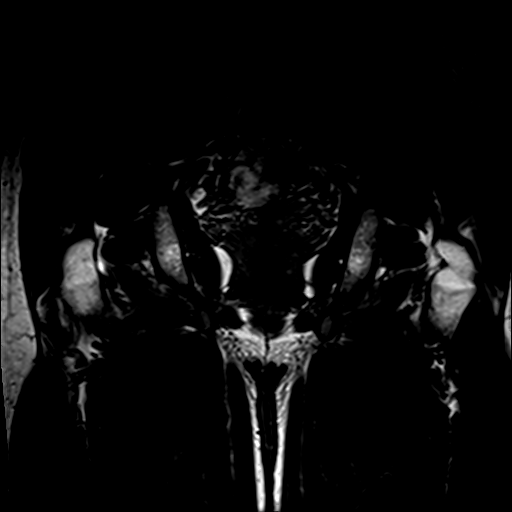
[im 9/18]
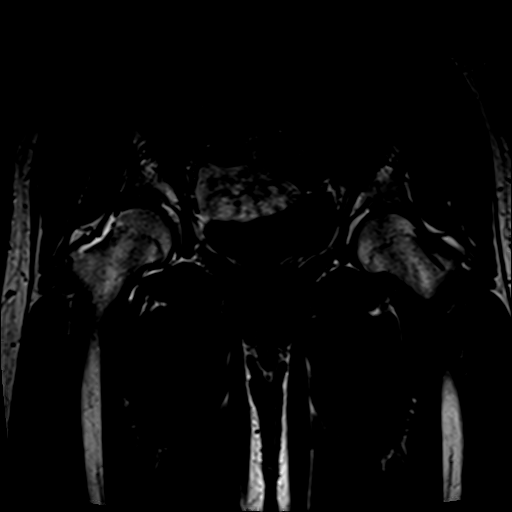
[im 15/18]
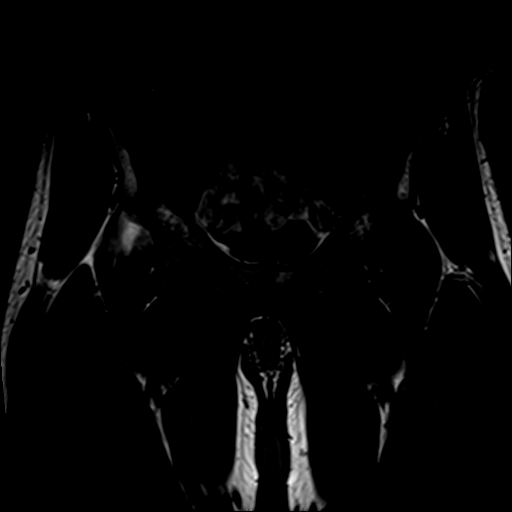

[20 of 40 positions shown; findings below may reference images not displayed]

FINDINGS: Bones: There is no fracture or avascular necrosis of the femoral
heads. Marrow signal is normal in the visualized femurs throughout
the pelvis and in the imaged lower lumbar spine.

Articular cartilage and labrum

Articular cartilage:  Appears normal.

Labrum:  Intact and normal in appearance.

Joint or bursal effusion

Joint effusion:  None.

Bursae:  Unremarkable.

Muscles and tendons

Muscles and tendons:  Intact and normal in appearance.

Other findings

Miscellaneous:  None.
IMPRESSION: Negative for labral tear.  Normal examination.

## 2021-04-21 DIAGNOSIS — M545 Low back pain, unspecified: Secondary | ICD-10-CM | POA: Diagnosis not present

## 2021-09-13 ENCOUNTER — Ambulatory Visit (HOSPITAL_COMMUNITY)
Admission: RE | Admit: 2021-09-13 | Discharge: 2021-09-13 | Disposition: A | Discharge status: Home / Self Care | Payer: BC Managed Care – PPO | Source: Ambulatory Visit | Attending: Family Medicine | Admitting: Family Medicine

## 2021-09-13 VITALS — BP 133/90 | HR 85 | Temp 98.7°F | Resp 18

## 2021-09-13 DIAGNOSIS — R0789 Other chest pain: Secondary | ICD-10-CM

## 2021-09-13 DIAGNOSIS — K921 Melena: Secondary | ICD-10-CM | POA: Diagnosis not present

## 2021-09-13 LAB — POCT URINALYSIS DIPSTICK, ED / UC
Bilirubin Urine: NEGATIVE
Glucose, UA: NEGATIVE mg/dL
Hgb urine dipstick: NEGATIVE
Ketones, ur: NEGATIVE mg/dL
Leukocytes,Ua: NEGATIVE
Nitrite: NEGATIVE
Protein, ur: NEGATIVE mg/dL
Specific Gravity, Urine: 1.015 (ref 1.005–1.030)
Urobilinogen, UA: 0.2 mg/dL (ref 0.0–1.0)
pH: 7.5 (ref 5.0–8.0)

## 2021-09-13 NOTE — ED Triage Notes (Signed)
Pt presents to the office for dysuria x 2 days. Pt reports constipation for several weeks. Pt reports some blood in his stool.

## 2021-09-14 ENCOUNTER — Telehealth (HOSPITAL_COMMUNITY): Payer: Self-pay | Admitting: Family Medicine

## 2021-09-14 NOTE — ED Provider Notes (Signed)
[facility]ENTER   [id number] 09/13/21 Arrival Time: 1047  ASSESSMENT & PLAN:  1. Chest wall pain   2. Hematochezia    Declines CXR. Suspect MSK etiology/strain. Prefers OTC analgesics. Declines rectal exam. Prefers GI eval. Referral placed. May f/u here as needed.  Reviewed expectations re: course of current medical issues. Questions answered. Outlined signs and symptoms indicating need for more acute intervention. Patient verbalized understanding. After Visit Summary given.   SUBJECTIVE: History from: patient.  [name] is a 31 y.o. male who reports seeing BRB in stool past few days. Feels occas constipation. Does sit on toilet for a long time when attempting BM. No specific straining. No pain with BM. Also reports L lower chest wall pain. No injury/traumna; past few days; noted after working out. Also ques dysuria; few days; mild. No hematuria. Associated symptoms: none reported. He denies SOB/abd pain. Appetite: normal. PO intake: normal. Ambulatory without assistance. Urinary symptoms: none. No tx PTA.  No past surgical history on file.  OBJECTIVE:  Vitals:   09/13/21 1103 09/13/21 1105 09/13/21 1106  BP: (!) 133/90    Pulse:   85  Resp: 18    Temp:  98.7 F (37.1 C)   TempSrc:  Oral   SpO2: 99%      General appearance: alert; no distress Oropharynx: moist Lungs: clear to auscultation bilaterally; unlabored Heart: regular rate and rhythm Abdomen: soft; non-distended; no guarding or rebound tenderness Rectal: declined Back: no CVA tenderness Extremities: no edema; symmetrical with no gross deformities Skin: warm; dry Neurologic: normal gait Psychological: alert and cooperative; normal mood and affect  Labs: Results for orders placed or performed during the hospital encounter of 09/13/21  POC Urinalysis dipstick  Result Value Ref Range   Glucose, UA NEGATIVE NEGATIVE mg/dL   Bilirubin Urine NEGATIVE NEGATIVE   Ketones, ur NEGATIVE NEGATIVE  mg/dL   Specific Gravity, Urine 1.015 1.005 - 1.030   Hgb urine dipstick NEGATIVE NEGATIVE   pH 7.5 5.0 - 8.0   Protein, ur NEGATIVE NEGATIVE mg/dL   Urobilinogen, UA 0.2 0.0 - 1.0 mg/dL   Nitrite NEGATIVE NEGATIVE   Leukocytes,Ua NEGATIVE NEGATIVE   Labs Reviewed  POCT URINALYSIS DIPSTICK, ED / UC    No Known Allergies                                             No past medical history on file. Social History   Socioeconomic History   Marital status: Single    Spouse name: Not on file   Number of children: Not on file   Years of education: Not on file   Highest education level: Not on file  Occupational History   Not on file  Tobacco Use   Smoking status: Never   Smokeless tobacco: Never  Substance and Sexual Activity   Alcohol use: Yes   Drug use: No   Sexual activity: Yes  Other Topics Concern   Not on file  Social History Narrative   Not on file   Social Determinants of Health   Financial Resource Strain: Not on file  Food Insecurity: Not on file  Transportation Needs: Not on file  Physical Activity: Not on file  Stress: Not on file  Social Connections: Not on file  Intimate Partner Violence: Not on file   Family History  Problem Relation Age of Onset   Hypertension  Father       Mardella Layman, MD 09/14/21 1106

## 2021-09-14 NOTE — Telephone Encounter (Signed)
Pt desires referreal to GI. Placed.  Referral Orders         Ambulatory referral to Gastroenterology

## 2021-09-15 DIAGNOSIS — Z6831 Body mass index (BMI) 31.0-31.9, adult: Secondary | ICD-10-CM | POA: Diagnosis not present

## 2021-09-15 DIAGNOSIS — K921 Melena: Secondary | ICD-10-CM | POA: Diagnosis not present

## 2021-09-19 ENCOUNTER — Encounter: Payer: Self-pay | Admitting: Nurse Practitioner

## 2021-10-06 DIAGNOSIS — R030 Elevated blood-pressure reading, without diagnosis of hypertension: Secondary | ICD-10-CM | POA: Diagnosis not present

## 2021-10-06 DIAGNOSIS — J020 Streptococcal pharyngitis: Secondary | ICD-10-CM | POA: Diagnosis not present

## 2021-10-06 DIAGNOSIS — Z6831 Body mass index (BMI) 31.0-31.9, adult: Secondary | ICD-10-CM | POA: Diagnosis not present

## 2021-10-06 DIAGNOSIS — J029 Acute pharyngitis, unspecified: Secondary | ICD-10-CM | POA: Diagnosis not present

## 2021-10-19 ENCOUNTER — Ambulatory Visit (INDEPENDENT_AMBULATORY_CARE_PROVIDER_SITE_OTHER): Payer: BC Managed Care – PPO | Admitting: Nurse Practitioner

## 2021-10-19 ENCOUNTER — Encounter: Payer: Self-pay | Admitting: Gastroenterology

## 2021-10-19 ENCOUNTER — Encounter: Payer: Self-pay | Admitting: Nurse Practitioner

## 2021-10-19 ENCOUNTER — Other Ambulatory Visit (INDEPENDENT_AMBULATORY_CARE_PROVIDER_SITE_OTHER): Payer: BC Managed Care – PPO

## 2021-10-19 VITALS — BP 136/82 | HR 76 | Ht 72.0 in | Wt 246.0 lb

## 2021-10-19 DIAGNOSIS — K219 Gastro-esophageal reflux disease without esophagitis: Secondary | ICD-10-CM | POA: Diagnosis not present

## 2021-10-19 DIAGNOSIS — K625 Hemorrhage of anus and rectum: Secondary | ICD-10-CM

## 2021-10-19 DIAGNOSIS — K529 Noninfective gastroenteritis and colitis, unspecified: Secondary | ICD-10-CM | POA: Diagnosis not present

## 2021-10-19 LAB — COMPREHENSIVE METABOLIC PANEL
ALT: 16 U/L (ref 0–53)
AST: 23 U/L (ref 0–37)
Albumin: 4.6 g/dL (ref 3.5–5.2)
Alkaline Phosphatase: 49 U/L (ref 39–117)
BUN: 21 mg/dL (ref 6–23)
CO2: 28 mEq/L (ref 19–32)
Calcium: 9.9 mg/dL (ref 8.4–10.5)
Chloride: 101 mEq/L (ref 96–112)
Creatinine, Ser: 1.29 mg/dL (ref 0.40–1.50)
GFR: 74.41 mL/min (ref >60.00)
Glucose, Bld: 73 mg/dL (ref 70–99)
Potassium: 4.2 mEq/L (ref 3.5–5.1)
Sodium: 137 mEq/L (ref 135–145)
Total Bilirubin: 0.5 mg/dL (ref 0.2–1.2)
Total Protein: 7.7 g/dL (ref 6.0–8.3)

## 2021-10-19 LAB — CBC WITH DIFFERENTIAL/PLATELET
Basophils Absolute: 0.1 10*3/uL (ref 0.0–0.1)
Basophils Relative: 0.9 % (ref 0.0–3.0)
Eosinophils Absolute: 0.2 10*3/uL (ref 0.0–0.7)
Eosinophils Relative: 1.9 % (ref 0.0–5.0)
HCT: 46.3 % (ref 39.0–52.0)
Hemoglobin: 15.4 g/dL (ref 13.0–17.0)
Lymphocytes Relative: 20 % (ref 12.0–46.0)
Lymphs Abs: 2.2 10*3/uL (ref 0.7–4.0)
MCHC: 33.3 g/dL (ref 30.0–36.0)
MCV: 93.6 fl (ref 78.0–100.0)
Monocytes Absolute: 0.6 10*3/uL (ref 0.1–1.0)
Monocytes Relative: 5.6 % (ref 3.0–12.0)
Neutro Abs: 8 10*3/uL — ABNORMAL HIGH (ref 1.4–7.7)
Neutrophils Relative %: 71.6 % (ref 43.0–77.0)
Platelets: 248 10*3/uL (ref 150.0–400.0)
RBC: 4.94 Mil/uL (ref 4.22–5.81)
RDW: 13 % (ref 11.5–15.5)
WBC: 11.2 10*3/uL — ABNORMAL HIGH (ref 4.0–10.5)

## 2021-10-19 LAB — TSH: TSH: 1 u[IU]/mL (ref 0.35–5.50)

## 2021-10-19 LAB — C-REACTIVE PROTEIN: CRP: <1 mg/dL (ref 0.5–20.0)

## 2021-10-19 MED ORDER — NA SULFATE-K SULFATE-MG SULF 17.5-3.13-1.6 GM/177ML PO SOLN: 1.0000 | Freq: Once | ORAL | 0 refills | Status: AC

## 2021-10-19 MED ORDER — OMEPRAZOLE 40 MG PO CPDR: 40.0000 mg | DELAYED_RELEASE_CAPSULE | Freq: Every day | ORAL | 2 refills | Status: AC

## 2021-10-19 MED ORDER — FAMOTIDINE 20 MG PO TABS: 20.0000 mg | ORAL_TABLET | Freq: Every day | ORAL | 2 refills | Status: AC

## 2021-10-19 NOTE — Patient Instructions (Addendum)
_______________________________________________________  If you are age 30 or older, your body mass index should be between 23-30. Your Body mass index is 30.36 kg/m. If this is out of the aforementioned range listed, please consider follow up with your Primary Care Provider.  If you are age 30 or younger, your body mass index should be between 19-25. Your Body mass index is 30.36 kg/m. If this is out of the aformentioned range listed, please consider follow up with your Primary Care Provider.   Start Imodium twice daily as needed. If you do not have a bowel movement within 24 hours please stop Imodium.  You have been scheduled for an endoscopy and colonoscopy. Please follow the written instructions given to you at your visit today. Please pick up your prep supplies at the pharmacy within the next 1-3 days. If you use inhalers (even only as needed), please bring them with you on the day of your procedure.   Your provider has requested that you go to the basement level for lab work before leaving today. Press "B" on the elevator. The lab is located at the first door on the left as you exit the elevator.  We have sent the following medications to your pharmacy for you to pick up at your convenience:Famotidine 20 mg daily at bedtime. Omeprazole 40 mg daily  30 minutes before breakfast.   Due to recent changes in healthcare laws, you may see the results of your imaging and laboratory studies on MyChart before your provider has had a chance to review them.  We understand that in some cases there may be results that are confusing or concerning to you. Not all laboratory results come back in the same time frame and the provider may be waiting for multiple results in order to interpret others.  Please give [place] 48 hours in order for your provider to thoroughly review all the results before contacting the office for clarification of your results.   It was a pleasure to see you today!  Thank you for  trusting me with your gastrointestinal care!       ________________________________________________________  The [facility] providers would like to encourage you to use [facility] to communicate with providers for non-urgent requests or questions.  Due to long hold times on the telephone, sending your provider a message by [facility] may be a faster and more efficient way to get a response.  Please allow 48 business hours for a response.  Please remember that this is for non-urgent requests.  _______________________________________________________

## 2021-10-19 NOTE — Progress Notes (Signed)
Attending Physician's Attestation   I have reviewed the chart.   I agree with the Advanced Practitioner's note, impression, and recommendations with any updates as below.    Renaldo Gornick Mansouraty, MD [facility] Advanced Endoscopy Office # [phone]  

## 2021-10-19 NOTE — Progress Notes (Signed)
10/19/2021 [name] [id number] [birth year]   CHIEF COMPLAINT: Acid reflux, chronic diarrhea   HISTORY OF PRESENT ILLNESS: [name] is a 31 year old male with a past medial history of anxiety and depression. He presents to our office today for further evaluation regarding constant acid reflux and chronic diarrhea for the past year.  He describes having daily burning acid reflux since last summer.  No dysphagia.  He has intermittent burping and sometimes feels it is difficult to burp.  He denies taking any acid reducing medications.   He complains of having acid reflux symptoms since summer 2022. He describes having constant heartburn. Sometimes has difficulty burping. He takes a deep breath, burps which causes pain. No dysphagia. He does not take any acid reducing medications.  No NSAID use.  He drinks 4-5 alcoholic beverages on the weekends.  He denies ever having an EGD.  He has chronic diarrhea, passes a nonbloody watery or mud like bowel movement 1-3 times daily for the past year.  No solid stools for the past year.  No specific food triggers.  He typically does not experience any abdominal pain, however, 4 weeks ago he experienced LUQ cramping for 1 week.  A few days later, he went to the bathroom and felt as if he was going to have a bowel movement but passed only bright red blood per the rectum x 1.  No associated rectal pain.  At that time, he went to the ED 09/13/2021 for further evaluation.  A urinalysis was negative.  Blood tests were not done. He declined a rectal exam and he was discharged home instructions to schedule GI evaluation.  Since his episode of rectal bleeding, he started eating a healthier high-fiber diet.  He continues to have loose stools daily.  His heartburn symptoms have not improved after changing his diet.  He has a history of anxiety and depression.  He feels his depression is well controlled but his anxiety level remains quite elevated.  He previously was a Customer service manager and felt his anxiety was related to his job.  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  He has intentionally lost 8 pounds over the past 2 months.  He eats [place] yogurt most days and eats cheese a few days weekly.  He drinks milk a few days weekly.  No known family history of IBD or colorectal cancer.  He reported taking an antibiotic recently for strep throat, his diarrhea did not improve or worsen after taking the antibiotic.  Past Medical History:  Diagnosis Date   Anxiety    Depression    Past Surgical History:  Procedure Laterality Date   WISDOM TOOTH EXTRACTION     Social History: Nonsmoker. He drinks  4 to 5 alcoholic beverages on the weekends. No drug use.   Family History: Paternal grandfather with history of pancreatic cancer.  Paternal grandmother with diabetes.  Father with hypertension.  No known family history of esophageal, gastric or colon cancer.  No Known Allergies    Outpatient Encounter Medications as of 10/19/2021  Medication Sig   meloxicam (MOBIC) 15 MG tablet Take 1 tablet (15 mg total) by mouth daily. (Patient not taking: Reported on 10/19/2021)   Miconazole Nitrate 2 % POWD Apply to itchy area two times a day for 2 weeks (Patient not taking: Reported on 10/19/2021)   No facility-administered encounter medications on file  as of 10/19/2021.    REVIEW OF SYSTEMS:  Gen: + Intentional weight loss. + Fatigue.  CV: Denies chest pain, palpitations or edema. Resp: Denies cough, shortness of breath of hemoptysis.  GI: See HPI. GU : Denies urinary burning, blood in urine, increased urinary frequency or incontinence. MS: + Back pain. Derm: Denies rash, itchiness, skin lesions or unhealing ulcers. Psych: + Anxiety and depression. Heme: Denies bruising, easy bleeding. Neuro:  Denies headaches, dizziness or paresthesias. Endo:  Denies any problems with DM, thyroid or adrenal  function.  PHYSICAL EXAM: BP 136/82   Pulse 76   Ht 6' (1.829 m)   Wt 246 lb (111.6 kg)   BMI 33.36 kg/m  General: 31 year old male in no acute distress. Head: Normocephalic and atraumatic. Eyes:  Sclerae non-icteric, conjunctive pink. Ears: Normal auditory acuity. Mouth: Dentition intact. No ulcers or lesions.  Neck: Supple, no lymphadenopathy or thyromegaly.  Lungs: Clear bilaterally to auscultation without wheezes, crackles or rhonchi. Heart: Regular rate and rhythm. No murmur, rub or gallop appreciated.  Abdomen: Soft, nontender, non distended. No masses. No hepatosplenomegaly. Normoactive bowel sounds x 4 quadrants.  Rectal: Deferred. Musculoskeletal: Symmetrical with no gross deformities. Skin: Warm and dry. No rash or lesions on visible extremities. Extremities: No edema. Neurological: Alert oriented x 4, no focal deficits.  Psychological:  Alert and cooperative. Normal mood and affect.  ASSESSMENT AND PLAN:  69) 31 year old male with acid reflux symptoms for the past 12 to 13 months -EGD to rule out GERD and Barrett's esophagus. Duodenal biopsies to rule out celiac disease. EGD benefits and risks discussed including risk with sedation, risk of bleeding, perforation and infection  -Continue GERD diet -Omeprazole 40 mg 1 p.o. daily to be taken 30 minutes before breakfast -Famotidine 20 mg nightly  2) Chronic diarrhea -Diagnostic colonoscopy to rule out IBD/colitis benefits and risks discussed including risk with sedation, risk of bleeding, perforation and infection  -Stop dairy products if symptoms worsen -Imodium 1-2 tabs twice daily, stop if no BM in 24 hours -CBC, CMP, TSH, CRP, TTG and IgA  3) Rectal bleeding x 1 -Diagnostic colonoscopy as ordered above  -Patient to contact her office if rectal bleeding recurs prior to his colonoscopy date  4) Anxiety -Advised patient to obtain a primary care physician facilitate anxiety management -Discussed seeing a therapist  for biofeedback -Exercise as tolerated  Further recommendations to be determined after the above evaluation completed          CC:  No ref. provider found

## 2021-10-20 LAB — IGA: Immunoglobulin A: 189 mg/dL (ref 47–310)

## 2021-10-20 LAB — TISSUE TRANSGLUTAMINASE, IGA: (tTG) Ab, IgA: <1 U/mL

## 2021-10-25 ENCOUNTER — Telehealth: Payer: Self-pay | Admitting: Gastroenterology

## 2021-10-25 ENCOUNTER — Other Ambulatory Visit: Payer: Self-pay

## 2021-10-25 ENCOUNTER — Ambulatory Visit (AMBULATORY_SURGERY_CENTER): Payer: BC Managed Care – PPO | Admitting: Gastroenterology

## 2021-10-25 ENCOUNTER — Encounter: Payer: Self-pay | Admitting: Gastroenterology

## 2021-10-25 ENCOUNTER — Encounter (HOSPITAL_COMMUNITY): Payer: Self-pay | Admitting: Emergency Medicine

## 2021-10-25 ENCOUNTER — Emergency Department (HOSPITAL_COMMUNITY)
Admission: EM | Admit: 2021-10-25 | Discharge: 2021-10-26 | Disposition: A | Discharge status: Home / Self Care | Payer: BC Managed Care – PPO | Attending: Emergency Medicine | Admitting: Emergency Medicine

## 2021-10-25 VITALS — BP 122/76 | HR 52 | Temp 98.9°F | Resp 8 | Ht 72.0 in | Wt 246.0 lb

## 2021-10-25 DIAGNOSIS — R7402 Elevation of levels of lactic acid dehydrogenase (LDH): Secondary | ICD-10-CM | POA: Diagnosis not present

## 2021-10-25 DIAGNOSIS — R1110 Vomiting, unspecified: Secondary | ICD-10-CM | POA: Diagnosis not present

## 2021-10-25 DIAGNOSIS — R112 Nausea with vomiting, unspecified: Secondary | ICD-10-CM | POA: Insufficient documentation

## 2021-10-25 DIAGNOSIS — K219 Gastro-esophageal reflux disease without esophagitis: Secondary | ICD-10-CM

## 2021-10-25 DIAGNOSIS — R1012 Left upper quadrant pain: Secondary | ICD-10-CM | POA: Diagnosis not present

## 2021-10-25 DIAGNOSIS — R7309 Other abnormal glucose: Secondary | ICD-10-CM | POA: Diagnosis not present

## 2021-10-25 DIAGNOSIS — K2970 Gastritis, unspecified, without bleeding: Secondary | ICD-10-CM | POA: Diagnosis not present

## 2021-10-25 DIAGNOSIS — K625 Hemorrhage of anus and rectum: Secondary | ICD-10-CM | POA: Diagnosis not present

## 2021-10-25 DIAGNOSIS — R7989 Other specified abnormal findings of blood chemistry: Secondary | ICD-10-CM

## 2021-10-25 DIAGNOSIS — K529 Noninfective gastroenteritis and colitis, unspecified: Secondary | ICD-10-CM | POA: Diagnosis not present

## 2021-10-25 DIAGNOSIS — R1010 Upper abdominal pain, unspecified: Secondary | ICD-10-CM | POA: Insufficient documentation

## 2021-10-25 DIAGNOSIS — K2289 Other specified disease of esophagus: Secondary | ICD-10-CM | POA: Diagnosis not present

## 2021-10-25 DIAGNOSIS — K641 Second degree hemorrhoids: Secondary | ICD-10-CM

## 2021-10-25 MED ORDER — SODIUM CHLORIDE 0.9 % IV SOLN: 4.0000 mg | Freq: Once | INTRAVENOUS | Status: DC

## 2021-10-25 MED ORDER — SODIUM CHLORIDE 0.9 % IV SOLN: 500.0000 mL | Freq: Once | INTRAVENOUS | Status: DC

## 2021-10-25 MED ORDER — METOCLOPRAMIDE HCL 5 MG/ML IJ SOLN
10.0000 mg | Freq: Once | INTRAMUSCULAR | Status: AC
  Administered 2021-10-26: 10 mg via INTRAVENOUS
  Filled 2021-10-25: qty 2

## 2021-10-25 MED ORDER — SODIUM CHLORIDE 0.9 % IV SOLN
4.0000 mg | Freq: Once | INTRAVENOUS | Status: AC
  Administered 2021-10-25: 4 mg via INTRAVENOUS

## 2021-10-25 MED ORDER — LACTATED RINGERS IV BOLUS
1000.0000 mL | Freq: Once | INTRAVENOUS | Status: AC
  Administered 2021-10-26: 1000 mL via INTRAVENOUS

## 2021-10-25 MED ORDER — MORPHINE SULFATE (PF) 4 MG/ML IV SOLN
4.0000 mg | Freq: Once | INTRAVENOUS | Status: AC
  Administered 2021-10-26: 4 mg via INTRAVENOUS
  Filled 2021-10-25: qty 1

## 2021-10-25 MED ORDER — ONDANSETRON 8 MG PO TBDP
8.0000 mg | ORAL_TABLET | Freq: Three times a day (TID) | ORAL | 0 refills | Status: DC | PRN
Start: 2021-10-25 — End: 2021-11-07

## 2021-10-25 NOTE — Progress Notes (Signed)
Report to PACU, RN, vss, BBS= Clear.  

## 2021-10-25 NOTE — Progress Notes (Signed)
GASTROENTEROLOGY PROCEDURE H&P NOTE   Primary Care Physician: Patient, No Pcp Per  HPI: Mayank Teuscher is a 31 y.o. male who presents for EGD/Colonoscopy for evaluation of GERD and chronic diarrhea and prior episode of rectal bleeding.  Past Medical History:  Diagnosis Date   Anxiety    Depression    Past Surgical History:  Procedure Laterality Date   WISDOM TOOTH EXTRACTION     Current Outpatient Medications  Medication Sig Dispense Refill   famotidine (PEPCID) 20 MG tablet Take 1 tablet (20 mg total) by mouth at bedtime. 30 tablet 2   meloxicam (MOBIC) 15 MG tablet Take 1 tablet (15 mg total) by mouth daily. (Patient not taking: Reported on 10/19/2021) 30 tablet 1   Miconazole Nitrate 2 % POWD Apply to itchy area two times a day for 2 weeks (Patient not taking: Reported on 10/19/2021) 500 g 1   omeprazole (PRILOSEC) 40 MG capsule Take 1 capsule (40 mg total) by mouth daily. Take 30 minutes before breakfast. 30 capsule 2   No current facility-administered medications for this visit.    Current Outpatient Medications:    famotidine (PEPCID) 20 MG tablet, Take 1 tablet (20 mg total) by mouth at bedtime., Disp: 30 tablet, Rfl: 2   meloxicam (MOBIC) 15 MG tablet, Take 1 tablet (15 mg total) by mouth daily. (Patient not taking: Reported on 10/19/2021), Disp: 30 tablet, Rfl: 1   Miconazole Nitrate 2 % POWD, Apply to itchy area two times a day for 2 weeks (Patient not taking: Reported on 10/19/2021), Disp: 500 g, Rfl: 1   omeprazole (PRILOSEC) 40 MG capsule, Take 1 capsule (40 mg total) by mouth daily. Take 30 minutes before breakfast., Disp: 30 capsule, Rfl: 2 No Known Allergies Family History  Problem Relation Age of Onset   Hypertension Father    Pancreatic cancer Maternal Grandfather    Diabetes Paternal Grandmother    Stomach cancer Neg Hx    Esophageal cancer Neg Hx    Colon cancer Neg Hx    Social History   Socioeconomic History   Marital status: Single    Spouse name: Not  on file   Number of children: 0   Years of education: Not on file   Highest education level: Not on file  Occupational History   Occupation: sales  Tobacco Use   Smoking status: Never   Smokeless tobacco: Never  Vaping Use   Vaping Use: Never used  Substance and Sexual Activity   Alcohol use: Yes    Comment: 4 drinks in a week   Drug use: No   Sexual activity: Yes  Other Topics Concern   Not on file  Social History Narrative   Not on file   Social Determinants of Health   Financial Resource Strain: Not on file  Food Insecurity: Not on file  Transportation Needs: Not on file  Physical Activity: Not on file  Stress: Not on file  Social Connections: Not on file  Intimate Partner Violence: Not on file    Physical Exam: There were no vitals filed for this visit. There is no height or weight on file to calculate BMI. GEN: NAD EYE: Sclerae anicteric ENT: MMM CV: Non-tachycardic GI: Soft, NT/ND NEURO:  Alert & Oriented x 3  Lab Results: No results for input(s): "WBC", "HGB", "HCT", "PLT" in the last 72 hours. BMET No results for input(s): "NA", "K", "CL", "CO2", "GLUCOSE", "BUN", "CREATININE", "CALCIUM" in the last 72 hours. LFT No results for input(s): "PROT", "  ALBUMIN", "AST", "ALT", "ALKPHOS", "BILITOT", "BILIDIR", "IBILI" in the last 72 hours. PT/INR No results for input(s): "LABPROT", "INR" in the last 72 hours.   Impression / Plan: This is a 31 y.o.male who presents for EGD/Colonoscopy for evaluation of GERD and chronic diarrhea and prior episode of rectal bleeding.  The risks and benefits of endoscopic evaluation/treatment were discussed with the patient and/or family; these include but are not limited to the risk of perforation, infection, bleeding, missed lesions, lack of diagnosis, severe illness requiring hospitalization, as well as anesthesia and sedation related illnesses.  The patient's history has been reviewed, patient examined, no change in status, and  deemed stable for procedure.  The patient and/or family is agreeable to proceed.    Corliss Parish, MD [facility] Advanced Endoscopy Office # [phone]

## 2021-10-25 NOTE — ED Provider Notes (Signed)
[facility]-EMERGENCY DEPT Provider Note   CSN: [id number] Arrival date & time: 10/25/21  2304     History  Chief Complaint  Patient presents with   Nausea   Emesis    [name] is a 31 y.o. male.  The history is provided by the patient.  Emesis He has history of anxiety, depression and had colonoscopy and upper endoscopy today and complains of upper abdominal pain and vomiting since then.  He has taken ondansetron oral dissolving tablet with no improvement.  He denies any pain in his chest, back, shoulders.  He denies fever, chills, sweats.   Home Medications Prior to Admission medications   Medication Sig Start Date End Date Taking? Authorizing Provider  famotidine (PEPCID) 20 MG tablet Take 1 tablet (20 mg total) by mouth at bedtime. 10/19/21   Arnaldo Natal, NP  omeprazole (PRILOSEC) 40 MG capsule Take 1 capsule (40 mg total) by mouth daily. Take 30 minutes before breakfast. 10/19/21   Arnaldo Natal, NP  ondansetron (ZOFRAN-ODT) 8 MG disintegrating tablet Take 1 tablet (8 mg total) by mouth every 8 (eight) hours as needed for nausea or vomiting. 10/25/21   Mansouraty, Netty Starring., MD      Allergies    Patient has no known allergies.    Review of Systems   Review of Systems  Gastrointestinal:  Positive for vomiting.  All other systems reviewed and are negative.   Physical Exam Updated Vital Signs BP (!) 145/95 (BP Location: Left Arm)   Pulse 62   Temp 98.7 F (37.1 C) (Oral)   Resp 18   Ht 6\' 1"  (1.854 m)   Wt 108.9 kg   SpO2 99%   BMI 31.66 kg/m  Physical Exam Vitals and nursing note reviewed.   31 year old male, appears uncomfortable, but is in no acute distress. Vital signs are significant for elevated blood pressure. Oxygen saturation is 99%, which is normal. Head is normocephalic and atraumatic. PERRLA, EOMI. Oropharynx is clear. Neck is nontender and supple without adenopathy or JVD. Back is nontender and  there is no CVA tenderness. Lungs are clear without rales, wheezes, or rhonchi. Chest is nontender. Heart has regular rate and rhythm without murmur. Abdomen is soft, flat, with mild epigastric tenderness.  There is no rebound or guarding.  Peristalsis is hypoactive. Extremities have no cyanosis or edema, full range of motion is present. Skin is warm and dry without rash. Neurologic: Mental status is normal, cranial nerves are intact, moves all extremities equally.  ED Results / Procedures / Treatments   Labs (all labs ordered are listed, but only abnormal results are displayed) Labs Reviewed  COMPREHENSIVE METABOLIC PANEL - Abnormal; Notable for the following components:      Result Value   Glucose, Bld 132 (*)    Total Protein 8.5 (*)    Albumin 5.1 (*)    All other components within normal limits  LACTIC ACID, PLASMA - Abnormal; Notable for the following components:   Lactic Acid, Venous 3.0 (*)    All other components within normal limits  CBC WITH DIFFERENTIAL/PLATELET - Abnormal; Notable for the following components:   WBC 17.4 (*)    Neutro Abs 14.7 (*)    Eosinophils Absolute 1.0 (*)    All other components within normal limits  LACTIC ACID, PLASMA   Radiology CT ABDOMEN PELVIS W CONTRAST  Result Date: 10/26/2021 CLINICAL DATA:  Left upper quadrant pain, nausea, vomiting after endoscopy/colonoscopy today. EXAM: CT ABDOMEN AND  PELVIS WITH CONTRAST TECHNIQUE: Multidetector CT imaging of the abdomen and pelvis was performed using the standard protocol following bolus administration of intravenous contrast. RADIATION DOSE REDUCTION: This exam was performed according to the departmental dose-optimization program which includes automated exposure control, adjustment of the mA and/or kV according to patient size and/or use of iterative reconstruction technique. CONTRAST:  OMNIPAQUE IOHEXOL 300 MG/ML  SOLN COMPARISON:  None Available. FINDINGS: Lower chest: No acute abnormality  Hepatobiliary: No focal hepatic abnormality. Gallbladder unremarkable. Pancreas: No focal abnormality or ductal dilatation. Spleen: No focal abnormality.  Normal size. Adrenals/Urinary Tract: No adrenal abnormality. No focal renal abnormality. No stones or hydronephrosis. Urinary bladder is unremarkable. Stomach/Bowel: Stomach, large and small bowel grossly unremarkable. Vascular/Lymphatic: No evidence of aneurysm or adenopathy. Reproductive: No visible focal abnormality. Other: No free fluid or free air. Musculoskeletal: No acute bony abnormality. IMPRESSION: No acute findings in the abdomen or pelvis. Electronically Signed   By: Charlett Nose M.D.   On: 10/26/2021 01:43    Procedures Procedures    Medications Ordered in ED Medications  lactated ringers bolus 1,000 mL (0 mLs Intravenous Stopped 10/26/21 0402)  morphine (PF) 4 MG/ML injection 4 mg (4 mg Intravenous Given 10/26/21 0020)  metoCLOPramide (REGLAN) injection 10 mg (10 mg Intravenous Given 10/26/21 0020)  iohexol (OMNIPAQUE) 300 MG/ML solution 100 mL (100 mLs Intravenous Contrast Given 10/26/21 0121)  lactated ringers bolus 1,000 mL (0 mLs Intravenous Stopped 10/26/21 0402)  morphine (PF) 4 MG/ML injection 4 mg (4 mg Intravenous Given 10/26/21 0154)  prochlorperazine (COMPAZINE) injection 10 mg (10 mg Intravenous Given 10/26/21 0213)    ED Course/ Medical Decision Making/ A&P                           Medical Decision Making Amount and/or Complexity of Data Reviewed Labs: ordered. Radiology: ordered.  Risk Prescription drug management.   Nausea, vomiting, upper abdominal pain post endoscopy.  While I need to be concerned about complication from endoscopy such as perforation, this is more likely persistent vomiting in response to anesthetic.  I have ordered IV fluids as well as IV morphine and metoclopramide and I have ordered screening labs of CBC and comprehensive metabolic panel with lipase.  I have also ordered CT of abdomen and  pelvis to look for evidence of complication of endoscopy.  Old records are reviewed showing upper endoscopy and colonoscopy earlier today with no significant findings on colonoscopy and findings of esophagitis on upper endoscopy.  With the initial treatment, patient continued to complain of pain and nausea.  He was given a second dose of morphine and a dose of prochlorperazine with significant improvement in symptoms.  I reviewed his laboratory tests and my interpretation is moderate leukocytosis which is felt to be stress related, mildly elevated random glucose level which is likely secondary to stress, elevated lactic acid level which is felt to be due to dehydration and not sepsis.  Additional IV fluid was ordered and repeat lactic acid level was normal.  I have ordered a prescription for prochlorperazine, patient is discharged with instructions to follow-up with his gastroenterologist.  Final Clinical Impression(s) / ED Diagnoses Final diagnoses:  Nausea and vomiting, unspecified vomiting type  Elevated lactic acid level  Elevated random blood glucose level    Rx / DC Orders ED Discharge Orders          Ordered    prochlorperazine (COMPAZINE) 10 MG tablet  Every 6 hours PRN  10/26/21 0448              Dione Booze, MD 10/26/21 [phone]

## 2021-10-25 NOTE — Progress Notes (Signed)
This patient was evaluated post-procedure and had nausea and vomiting by our RN staff. I evaluated patient in normal post-procedure timing and by the time I had seen him, he had stopped having overt vomiting but still had nausea.  The output was coffee-ground in nature due to the recent biopsies that were obtained from the EGD.  He denied any abdominal pain throughout this. We gave the patient time and 8 mg of IV zofran total. He was hemodynamically stable and I evaluated him on 3 occasions post-procedure and also before discharge. He did not want any further anti-emetics to be sent to the pharmacy. He was to continue his current medication regimen. He agreed to this plan of action. His father (whom he did not want overt information given too due to HIPPA) was made aware of the need to monitor son longer but not given any information about his EGD/Colonoscopy findings.  Corliss Parish, MD [facility] Advanced Endoscopy Office # [phone]

## 2021-10-25 NOTE — Telephone Encounter (Addendum)
Called by patient's father again this evening. Even with Pepto-bismol and with Zofran ODT, patient is still having nausea and vomiting. Abdominal discomfort is still present but no worse than when called earlier today. At this point, there is nothing further that can be done as an outpatient. The etiology of these symptoms is beyond what would be expected from a typical post-procedural perspective. So next steps in evaluation should be laboratories to rule out internal bleeding/electrolyte disturbances, so recommend CBC/CMP/Mg/Phos/TSH/Cortisol be obtained.   Patient will need imaging (at minimum KUB and CXR but suspect cross-sectional imaging of the abdomen/pelvis will need to be obtained more likely) to rule out a post-procedural complication. Anti-emetics as per IV will likely be necessary. The patient and father agree to this plan of action. They will come to the [facility] ED for further evaluation. I called the [facility] ED Team to let them know the patient will be coming via personal transportation, and for them to try to work him in as quickly as possible in his evaluation. I appreciate the care of the ED team and am available for discussion/consultation. I will place my Inpatient GI team on this message as well, in case this patient ends up needing admission/observation into the hospital.   Corliss Parish, MD [facility] Advanced Endoscopy Office # [phone]

## 2021-10-25 NOTE — Op Note (Signed)
[place] Patient Name: [name] Procedure Date: 10/25/2021 10:32 AM MRN: [id number] Endoscopist: Justice Britain , MD Age: 31 Referring MD:  Date of Birth: September 25, 1990 Gender: Male Account #: [id number] Procedure:                Colonoscopy Indications:              Chronic diarrhea Medicines:                Monitored Anesthesia Care Procedure:                Pre-Anesthesia Assessment:                           - Prior to the procedure, a History and Physical                            was performed, and patient medications and                            allergies were reviewed. The patient's tolerance of                            previous anesthesia was also reviewed. The risks                            and benefits of the procedure and the sedation                            options and risks were discussed with the patient.                            All questions were answered, and informed consent                            was obtained. Prior Anticoagulants: The patient has                            taken no previous anticoagulant or antiplatelet                            agents. ASA Grade Assessment: II - A patient with                            mild systemic disease. After reviewing the risks                            and benefits, the patient was deemed in                            satisfactory condition to undergo the procedure.                           After obtaining informed consent, the colonoscope  was passed under direct vision. Throughout the                            procedure, the patient's blood pressure, pulse, and                            oxygen saturations were monitored continuously. The                            CF [id number] #[id number] was introduced through the anus                            and advanced to the the cecum, identified by                            appendiceal orifice and ileocecal valve. The                             colonoscopy was somewhat difficult due to                            significant looping. Successful completion of the                            procedure was aided by changing the patient's                            position, using manual pressure, straightening and                            shortening the scope to obtain bowel loop reduction                            and using scope torsion. The patient tolerated the                            procedure. The quality of the bowel preparation was                            good. The ileocecal valve, appendiceal orifice, and                            rectum were photographed. Scope In: 11:00:07 AM Scope Out: 11:12:44 AM Scope Withdrawal Time: 0 hours 9 minutes 27 seconds  Total Procedure Duration: 0 hours 12 minutes 37 seconds  Findings:                 The digital rectal exam findings include                            hemorrhoids. Pertinent negatives include no                            palpable rectal lesions.  The colon (entire examined portion) revealed                            significantly excessive looping.                           Normal mucosa was found in the entire colon.                            Biopsies for histology were taken with a cold                            forceps from the entire colon for evaluation of                            microscopic colitis.                           Non-bleeding non-thrombosed external and internal                            hemorrhoids were found during retroflexion, during                            perianal exam and during digital exam. The                            hemorrhoids were Grade II (internal hemorrhoids                            that prolapse but reduce spontaneously). Complications:            No immediate complications. Estimated Blood Loss:     Estimated blood loss was minimal. Impression:               -  Hemorrhoids found on digital rectal exam.                           - There was significant looping of the colon.                           - Normal mucosa in the entire examined colon.                            Biopsied.                           - Non-bleeding non-thrombosed external and internal                            hemorrhoids. Recommendation:           - The patient will be observed post-procedure,                            until all discharge criteria are met.                           -  Discharge patient to home.                           - Patient has a contact number available for                            emergencies. The signs and symptoms of potential                            delayed complications were discussed with the                            patient. Return to normal activities tomorrow.                            Written discharge instructions were provided to the                            patient.                           - High fiber diet.                           - May consider high dose bulking fiber with                            Benefiber 2-3 times daily (will hold on that while                            pathology is pending).                           - Consider EPI evaluation and SIBO breath testing                            in future. Although no formal stool studies were                            performed, infectious etiologies seem less likely                            based on chronicity and colon findings today.                           - Continue present medications.                           - Await pathology results.                           - Repeat colonoscopy for surveillance based on                            pathology results. If no evidence of chronic  colitis, then should start Colon Cancer screening                            at age 43.                           - The findings and recommendations  were discussed                            with the patient. Justice Britain, MD 10/25/2021 11:23:54 AM

## 2021-10-25 NOTE — Progress Notes (Signed)
Pt's states no medical or surgical changes since previsit or office visit. VS assessed by C.W 

## 2021-10-25 NOTE — ED Triage Notes (Signed)
Pt with n/v after endoscopy/colonoscopy today.  Sent by GI d/t persistent abdominal pain with n/v since procedure not responsive to zofran.

## 2021-10-25 NOTE — Patient Instructions (Addendum)
High fiber diet. Continue present medications.  YOU HAD AN ENDOSCOPIC PROCEDURE TODAY: Refer to the procedure report and other information in the discharge instructions given to you for any specific questions about what was found during the examination. If this information does not answer your questions, please call [facility] office at [phone] to clarify.   YOU SHOULD EXPECT: Some feelings of bloating in the abdomen. Passage of more gas than usual. Walking can help get rid of the air that was put into your GI tract during the procedure and reduce the bloating. If you had a lower endoscopy (such as a colonoscopy or flexible sigmoidoscopy) you may notice spotting of blood in your stool or on the toilet paper. Some abdominal soreness may be present for a day or two, also.  DIET: Your first meal following the procedure should be a light meal and then it is ok to progress to your normal diet. A half-sandwich or bowl of soup is an example of a good first meal. Heavy or fried foods are harder to digest and may make you feel nauseous or bloated. Drink plenty of fluids but you should avoid alcoholic beverages for 24 hours. If you had a esophageal dilation, please see attached instructions for diet.    ACTIVITY: Your care partner should take you home directly after the procedure. You should plan to take it easy, moving slowly for the rest of the day. You can resume normal activity the day after the procedure however YOU SHOULD NOT DRIVE, use power tools, machinery or perform tasks that involve climbing or major physical exertion for 24 hours (because of the sedation medicines used during the test).   SYMPTOMS TO REPORT IMMEDIATELY: A gastroenterologist can be reached at any hour. Please call [phone]  for any of the following symptoms:  Following lower endoscopy (colonoscopy, flexible sigmoidoscopy) Excessive amounts of blood in the stool  Significant tenderness, worsening of abdominal pains  Swelling  of the abdomen that is new, acute  Fever of 100 or higher  Following upper endoscopy (EGD, EUS, ERCP, esophageal dilation) Vomiting of blood or coffee ground material  New, significant abdominal pain  New, significant chest pain or pain under the shoulder blades  Painful or persistently difficult swallowing  New shortness of breath  Black, tarry-looking or red, bloody stools  FOLLOW UP:  If any biopsies were taken you will be contacted by phone or by letter within the next 1-3 weeks. Call [phone]  if you have not heard about the biopsies in 3 weeks.  Please also call with any specific questions about appointments or follow up tests.

## 2021-10-25 NOTE — Telephone Encounter (Signed)
Called by patient's father this evening. I performed an EGD/Colonoscopy on patient today. Provation notes in chart. Overall relatively straightforward procedures though looping was present in the patient's colon that required some maneuvers as previously outlined. Post-procedure had nausea and vomiting. This was treated with Zofran as outlined in out notes. Patient was able to be discharged. He did not have any abdominal pain at the time of discharge, and I evaluated just prior. He deferred on any anti-emetics post-procedure to be sent. Talking with father, he states that patient had vomiting multiple times post-leaving the office. He has tried to rest but is still having vomiting of gastric contents. He feels some discomfort in his upper abdomen which he feels is more from his retching, but this seems to be more that would be expected. We have decided on the following plan: Try Pepto-Bismol OTC x 1 dose now I am calling in Zofran 8 mg ODT and he will pick that up and take that this evening. If within the next few hours he is not feeling better, he will need to come into the emergency room for further evaluation and additional imaging to ensure that there is not a complication post-procedure and he will need to come into the ED at [facility] or [facility] or [facility] to have that done (most likely [facility] since he lives in downtown [place]). They agree with this plan of action.  Patty, please reach out to patient tomorrow, if you do not see any further updates from me overnight and please give me an update. Thanks.  GM

## 2021-10-25 NOTE — Op Note (Signed)
[facility] Patient Name: [name] Procedure Date: 10/25/2021 10:37 AM MRN: [id number] Endoscopist: Corliss Parish , MD Age: 31 Referring MD:  Date of Birth: 10-27-1990 Gender: Male Account #: [id number] Procedure:                Upper GI endoscopy Indications:              Heartburn, Esophageal reflux symptoms that persist                            despite appropriate therapy, Diarrhea Medicines:                Monitored Anesthesia Care Procedure:                Pre-Anesthesia Assessment:                           - Prior to the procedure, a History and Physical                            was performed, and patient medications and                            allergies were reviewed. The patient's tolerance of                            previous anesthesia was also reviewed. The risks                            and benefits of the procedure and the sedation                            options and risks were discussed with the patient.                            All questions were answered, and informed consent                            was obtained. Prior Anticoagulants: The patient has                            taken no previous anticoagulant or antiplatelet                            agents. ASA Grade Assessment: II - A patient with                            mild systemic disease. After reviewing the risks                            and benefits, the patient was deemed in                            satisfactory condition to undergo the procedure.  After obtaining informed consent, the endoscope was                            passed under direct vision. Throughout the                            procedure, the patient's blood pressure, pulse, and                            oxygen saturations were monitored continuously. The                            Endoscope was introduced through the mouth, and                            advanced to the  second part of duodenum. The upper                            GI endoscopy was accomplished without difficulty.                            The patient tolerated the procedure. Scope In: Scope Out: Findings:                 Minor mucosal changes including congestion (edema)                            were found in the entire esophagus. Biopsies were                            taken with a cold forceps for histology to rule out                            EoE/LoE.                           LA Grade A (one or more mucosal breaks less than 5                            mm, not extending between tops of 2 mucosal folds)                            esophagitis with no bleeding was found in the very                            distal esophagus.                           The Z-line was irregular and was found 38 cm from                            the incisors.                           A  2 cm hiatal hernia was present.                           Patchy mildly erythematous mucosa without bleeding                            was found in the entire examined stomach. Biopsies                            were taken with a cold forceps for histology and                            Helicobacter pylori testing.                           No gross lesions were noted in the duodenal bulb,                            in the first portion of the duodenum and in the                            second portion of the duodenum. Biopsies were taken                            with a cold forceps for histology. Complications:            No immediate complications. Estimated Blood Loss:     Estimated blood loss was minimal. Impression:               - Esophageal mucosal changes of congestion noted -                            biopsied.                           - LA Grade A esophagitis with no bleeding in very                            distal esophagus.                           - Z-line irregular, 38 cm from the  incisors.                           - 2 cm hiatal hernia.                           - Erythematous mucosa in the stomach. Biopsied.                           - No gross lesions in the duodenal bulb, in the                            first portion of the duodenum and in the second  portion of the duodenum. Biopsied. Recommendation:           - Proceed to scheduled colonoscopy.                           - Continue present medications.                           - May consider role of PPI switch to                            Aciphex/Dexilant depending on how patient does with                            continued therapy.                           - If issues persist and no evidence of EoE/LoE,                            then recommend consideration of pH impedence                            testing + Manometry in the future (should we ever                            consider role of HH repair and fundoplication.                           - The findings and recommendations were discussed                            with the patient. Corliss Parish, MD 10/25/2021 11:19:19 AM

## 2021-10-26 ENCOUNTER — Emergency Department (HOSPITAL_COMMUNITY): Payer: BC Managed Care – PPO

## 2021-10-26 ENCOUNTER — Telehealth: Payer: Self-pay

## 2021-10-26 ENCOUNTER — Telehealth: Payer: Self-pay | Admitting: Internal Medicine

## 2021-10-26 DIAGNOSIS — R1012 Left upper quadrant pain: Secondary | ICD-10-CM | POA: Diagnosis not present

## 2021-10-26 DIAGNOSIS — R1110 Vomiting, unspecified: Secondary | ICD-10-CM | POA: Diagnosis not present

## 2021-10-26 LAB — COMPREHENSIVE METABOLIC PANEL
ALT: 19 U/L (ref 0–44)
AST: 29 U/L (ref 15–41)
Albumin: 5.1 g/dL — ABNORMAL HIGH (ref 3.5–5.0)
Alkaline Phosphatase: 48 U/L (ref 38–126)
Anion gap: 11 (ref 5–15)
BUN: 12 mg/dL (ref 6–20)
CO2: 23 mmol/L (ref 22–32)
Calcium: 10 mg/dL (ref 8.9–10.3)
Chloride: 105 mmol/L (ref 98–111)
Creatinine, Ser: 1.04 mg/dL (ref 0.61–1.24)
GFR, Estimated: >60 mL/min (ref >60)
Glucose, Bld: 132 mg/dL — ABNORMAL HIGH (ref 70–99)
Potassium: 4.1 mmol/L (ref 3.5–5.1)
Sodium: 139 mmol/L (ref 135–145)
Total Bilirubin: 0.9 mg/dL (ref 0.3–1.2)
Total Protein: 8.5 g/dL — ABNORMAL HIGH (ref 6.5–8.1)

## 2021-10-26 LAB — CBC WITH DIFFERENTIAL/PLATELET
Abs Immature Granulocytes: 0.06 10*3/uL (ref 0.00–0.07)
Basophils Absolute: 0.1 10*3/uL (ref 0.0–0.1)
Basophils Relative: 0 %
Eosinophils Absolute: 1 10*3/uL — ABNORMAL HIGH (ref 0.0–0.5)
Eosinophils Relative: 6 %
HCT: 47.9 % (ref 39.0–52.0)
Hemoglobin: 15.8 g/dL (ref 13.0–17.0)
Immature Granulocytes: 0 %
Lymphocytes Relative: 6 %
Lymphs Abs: 1.1 10*3/uL (ref 0.7–4.0)
MCH: 31.7 pg (ref 26.0–34.0)
MCHC: 33 g/dL (ref 30.0–36.0)
MCV: 96 fL (ref 80.0–100.0)
Monocytes Absolute: 0.5 10*3/uL (ref 0.1–1.0)
Monocytes Relative: 3 %
Neutro Abs: 14.7 10*3/uL — ABNORMAL HIGH (ref 1.7–7.7)
Neutrophils Relative %: 85 %
Platelets: 272 10*3/uL (ref 150–400)
RBC: 4.99 MIL/uL (ref 4.22–5.81)
RDW: 12.3 % (ref 11.5–15.5)
WBC: 17.4 10*3/uL — ABNORMAL HIGH (ref 4.0–10.5)
nRBC: 0 % (ref 0.0–0.2)

## 2021-10-26 LAB — LACTIC ACID, PLASMA
Lactic Acid, Venous: 3 mmol/L (ref 0.5–1.9)
Lactic Acid, Venous: 0.8 mmol/L (ref 0.5–1.9)

## 2021-10-26 MED ORDER — IOHEXOL 300 MG/ML  SOLN
100.0000 mL | Freq: Once | INTRAMUSCULAR | Status: AC | PRN
  Administered 2021-10-26: 100 mL via INTRAVENOUS

## 2021-10-26 MED ORDER — LACTATED RINGERS IV BOLUS
1000.0000 mL | Freq: Once | INTRAVENOUS | Status: AC
  Administered 2021-10-26: 1000 mL via INTRAVENOUS

## 2021-10-26 MED ORDER — PROMETHAZINE HCL 25 MG RE SUPP: 25.0000 mg | Freq: Four times a day (QID) | RECTAL | 0 refills | Status: DC | PRN

## 2021-10-26 MED ORDER — PROCHLORPERAZINE MALEATE 10 MG PO TABS: 10.0000 mg | ORAL_TABLET | Freq: Four times a day (QID) | ORAL | 0 refills | Status: DC | PRN

## 2021-10-26 MED ORDER — MORPHINE SULFATE (PF) 4 MG/ML IV SOLN
4.0000 mg | Freq: Once | INTRAVENOUS | Status: AC
  Administered 2021-10-26: 4 mg via INTRAVENOUS
  Filled 2021-10-26: qty 1

## 2021-10-26 MED ORDER — PROCHLORPERAZINE EDISYLATE 10 MG/2ML IJ SOLN
10.0000 mg | Freq: Once | INTRAMUSCULAR | Status: AC
  Administered 2021-10-26: 10 mg via INTRAVENOUS
  Filled 2021-10-26: qty 2

## 2021-10-26 NOTE — Telephone Encounter (Signed)
No answer, left message to call if having any issues or concerns, B.Eric Morganti RN 

## 2021-10-26 NOTE — Telephone Encounter (Signed)
Persistent nausea and vomiting started after EGD/colonoscopy yesterday   ED visit yesterday and today - CT negative, hydrated, labs ok except lactic acidosis but that resolved  He stopped vomiting w/ prochlorperazine IV.  Tablets Rx but unable to keep down  Vomited 8 more times today  Will try promethazine suppositories ([facility] did not have prochlorperazine)  Meds ordered this encounter  Medications   promethazine (PHENERGAN) 25 MG suppository    Sig: Place 1 suppository (25 mg total) rectally every 6 (six) hours as needed for nausea or vomiting.    Dispense:  12 each    Refill:  0   If this fails back to ED  Advised we will arrange outpatient GI f/u also

## 2021-10-26 NOTE — Telephone Encounter (Signed)
Tried again to reach the pt- no answer had to leave message FYI Dr Meridee Score

## 2021-10-26 NOTE — Telephone Encounter (Signed)
Left message on machine to call back  

## 2021-10-27 NOTE — Telephone Encounter (Signed)
Patty, Thank you for trying.  Please work on trying to reach him on Friday. GM

## 2021-10-27 NOTE — Telephone Encounter (Signed)
CG, Thank you for sending this. You probably saw that the [facility] staff and Patty had tried to reach the patient on multiple occasions yesterday without success. I am confused as to what is going on with his persistent symptoms with negative work-up up to this point. I do agree that if the nausea symptoms and vomiting are persisting he likely needs another evaluation in the emergency department and potential observation. Hopefully the suppositories have helped. Was there a different phone number that they had given to you to reach the patient when he called tonight? Alexia Freestone was going to reach out to the patient on Friday anyways since they could not reach him yesterday. Thanks. GM

## 2021-10-27 NOTE — Telephone Encounter (Signed)
I spoke with the father and he states that at 1030 pm pt continued to have nausea and vomiting.  He tried to contact him via 3 way and the pt did not answer.  The pt father will try and get the pt and have him call to update [place] on his condition

## 2021-10-27 NOTE — Telephone Encounter (Signed)
Thanks for trying. GM 

## 2021-10-27 NOTE — Telephone Encounter (Signed)
I was contacted by his father who then linked the patient to the call (3 way)  [phone]

## 2021-10-28 ENCOUNTER — Encounter: Payer: Self-pay | Admitting: Gastroenterology

## 2021-11-07 ENCOUNTER — Encounter: Payer: Self-pay | Admitting: Internal Medicine

## 2021-11-07 ENCOUNTER — Other Ambulatory Visit: Payer: Self-pay

## 2021-11-07 ENCOUNTER — Ambulatory Visit (INDEPENDENT_AMBULATORY_CARE_PROVIDER_SITE_OTHER): Payer: BC Managed Care – PPO | Admitting: Internal Medicine

## 2021-11-07 VITALS — BP 124/82 | HR 58 | Temp 97.6°F | Ht 72.0 in | Wt 245.0 lb

## 2021-11-07 DIAGNOSIS — K8020 Calculus of gallbladder without cholecystitis without obstruction: Secondary | ICD-10-CM

## 2021-11-07 DIAGNOSIS — K219 Gastro-esophageal reflux disease without esophagitis: Secondary | ICD-10-CM | POA: Insufficient documentation

## 2021-11-07 DIAGNOSIS — K2100 Gastro-esophageal reflux disease with esophagitis, without bleeding: Secondary | ICD-10-CM | POA: Diagnosis not present

## 2021-11-07 DIAGNOSIS — F419 Anxiety disorder, unspecified: Secondary | ICD-10-CM

## 2021-11-07 NOTE — Progress Notes (Signed)
   Subjective:   Patient ID: [name], male    DOB: 10-23-1990, 31 y.o.   MRN: [id number]  HPI The patient is a new 31 YO man coming in for concerns. Recent stomach problems and some anxiety.   PMH, [facility], social history reviewed and updated  Review of Systems  Constitutional: Negative.   HENT: Negative.    Eyes: Negative.   Respiratory:  Negative for cough, chest tightness and shortness of breath.   Cardiovascular:  Negative for chest pain, palpitations and leg swelling.  Gastrointestinal:  Positive for abdominal pain. Negative for abdominal distention, constipation, diarrhea, nausea and vomiting.  Musculoskeletal: Negative.   Skin: Negative.   Neurological: Negative.   Psychiatric/Behavioral:  Positive for dysphoric mood. The patient is nervous/anxious.     Objective:  Physical Exam Constitutional:      Appearance: He is well-developed.  HENT:     Head: Normocephalic and atraumatic.  Cardiovascular:     Rate and Rhythm: Normal rate and regular rhythm.  Pulmonary:     Effort: Pulmonary effort is normal. No respiratory distress.     Breath sounds: Normal breath sounds. No wheezing or rales.  Abdominal:     General: Bowel sounds are normal. There is no distension.     Palpations: Abdomen is soft.     Tenderness: There is no abdominal tenderness. There is no rebound.  Musculoskeletal:     Cervical back: Normal range of motion.  Skin:    General: Skin is warm and dry.  Neurological:     Mental Status: He is alert and oriented to person, place, and time.     Coordination: Coordination normal.     Vitals:   11/07/21 1115  BP: 124/82  Pulse: (!) 58  Temp: 97.6 F (36.4 C)  SpO2: 99%  Weight: 245 lb (111.1 kg)  Height: 6' (1.829 m)    Assessment & Plan:

## 2021-11-07 NOTE — Patient Instructions (Signed)
We will get you in with a therapist to help.

## 2021-11-10 DIAGNOSIS — F419 Anxiety disorder, unspecified: Secondary | ICD-10-CM | POA: Insufficient documentation

## 2021-11-10 NOTE — Assessment & Plan Note (Signed)
Referral to counseling and he wants to work on Radiographer, therapeutic. He does exercise 5 times a week at least and this helps significantly.

## 2021-11-10 NOTE — Assessment & Plan Note (Signed)
Taking pepcid 20 mg daily qhs and omeprazole 40 mg daily. Still having some symptoms. Diet seems good. Stress may be contributing. We talked about coping mechanisms. He has a lot of job related stress.

## 2021-11-16 ENCOUNTER — Ambulatory Visit (INDEPENDENT_AMBULATORY_CARE_PROVIDER_SITE_OTHER): Payer: BC Managed Care – PPO | Admitting: Psychology

## 2021-11-16 DIAGNOSIS — F320 Major depressive disorder, single episode, mild: Secondary | ICD-10-CM | POA: Diagnosis not present

## 2021-11-16 DIAGNOSIS — Z600 Problems of adjustment to life-cycle transitions: Secondary | ICD-10-CM

## 2021-11-16 NOTE — Progress Notes (Signed)
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  He states that his stress is creating physical/medical problems and in his relationships.  [name] broke up with his girlfriend in November and they have been talking on and off.  He is hopeful that they will get back together again but he's afraid to ask if she does.    His parents divorced two years ago.  They waited until their boys were our of school.  Older sister [name] (39) lives in [place] and has three kids who are in [address].  His sister is divorced but in a long term relationship.  Younger brother, [name] who lives in [place] and works for [employer] and is in it as a Event organiser.  [name] came out as bi-sexual and his Osborn is not on board.  Randy relationship with his dad got better once he went to college.  Growing up he was closer to his mom, but after the divorce he has a lot of resentment towards her.  She is constantly cutting his Osborn down when he is with her.     Mental Status Exam: Appearance:   Fairly Groomed     Behavior:  Appropriate  Motor:  Normal  Speech/Language:   NA  Affect:  Depressed  Mood:  normal  Thought process:  normal  Thought content:    WNL  Sensory/Perceptual disturbances:    WNL  Orientation:  oriented to person, place, time/date, and situation  Attention:  NA  Concentration:  Good  Memory:  WNL  Fund of knowledge:   Good  Insight:    Good  Judgment:   Good  Impulse Control:  Good    Reported Symptoms:   PHQ-9 = (      ) feeling down,  GAD-7 = (      )  Risk Assessment: Danger to Self:  No Self-injurious Behavior: No Danger to Others: No Duty to Warn:no Physical Aggression /  Violence:No  Access to Firearms a concern: No  Gang Involvement:No   Substance Abuse History: Current substance abuse: No , PaGF - alcoholic    Past Psychiatric History:   No previous psychological problems have been observed Outpatient Providers:  History of Psych Hospitalization: No  Psychological Testing:  n/a    Abuse History:  Victim of: No.,  n/a    Report needed: No. Victim of Neglect:No. Perpetrator of  /a   Witness / Exposure to Domestic Violence:  n/a   Management consultant Involvement:  n/a Witness to [employer] Violence:   n/a  Family History:  Family History  Problem Relation Age of Onset   Hypertension Osborn    Pancreatic cancer Maternal Grandfather    Diabetes Paternal Grandmother    Stomach cancer Neg Hx    Esophageal cancer Neg Hx    Colon cancer Neg Hx     Living situation: the patient lives alone  Sexual Orientation: Straight  Relationship Status: single  Name of spouse / other: n/a If a parent, number of children / ages:   Support Systems: friends - some friends since grade school parents  Financial Stress:  No   Income/Employment/Disability: Employment Works in Technical sales engineer  Military Service: No   Educational History: Education: some college  Religion/Sprituality/World View: N/a  Any cultural differences that may affect / interfere with treatment:  not applicable   Recreation/Hobbies: working out,   Stressors: Health problems   Other: work    Strengths: Supportive Relationships, Family, and Friends  Barriers:  n/a   Legal History: Pending legal issue / charges: The patient has no significant history of legal issues. History of legal issue / charges:  n/a  Medical History/Surgical History: reviewed Past Medical History:  Diagnosis Date   Anxiety    Depression     Past Surgical History:  Procedure Laterality Date   WISDOM TOOTH EXTRACTION      Medications: Current Outpatient Medications  Medication Sig  Dispense Refill   famotidine (PEPCID) 20 MG tablet Take 1 tablet (20 mg total) by mouth at bedtime. 30 tablet 2   omeprazole (PRILOSEC) 40 MG capsule Take 1 capsule (40 mg total) by mouth daily. Take 30 minutes before breakfast. 30 capsule 2   No current facility-administered medications for this visit.    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  Pt states that his stress is creating physical/medical problems as well as difficulties in his relationships.  It is believed that [name] would benefit from a weekly individual psychotherapy which would focus on providing support around issues related to family dynamics work place stress.  Treatment would work to build positive coping strategies and skills which would allow patient to manage mood states more effectively as well as prevent relapse. Psychotherapy will also focus on communication and other relationship skills to better negotiate stress in these important relationship.  Therapist will continue to monitor need for medication and follow up with PCP for medication.   [name], PhD

## 2021-11-30 ENCOUNTER — Ambulatory Visit: Payer: BC Managed Care – PPO | Admitting: Psychology

## 2021-12-05 NOTE — Progress Notes (Unsigned)
12/06/2021 [name] [id number] 06/18/1990   Chief Complaint: Follow up after upper endoscopy and colonoscopy   History of Present Illness: [name] is a 31 year old male with a past medial history of anxiety and depression. I last saw him office on 10/19/2021 due to having acid reflux and chronic diarrhea. He underwent an EGD and colonoscopy by Dr. Rush Landmark 10/25/2021 show identified grade a reflux esophagitis and gastric biopsies were negative for H. pylori.  The colonoscopy identified internal and external hemorrhoids otherwise was normal.  Random colon biopsies were negative for microscopic colitis or IBD.  He developed postprocedure nausea and vomiting for which he received Zofran and he was discharged home in stable condition.  However, upon returning home he continued to have nausea and vomiting and developed significant upper abdominal pain.  He was sent to [facility] ED for further evaluation.  Labs in the ED showed a WBC count of 17.4.  Sodium 139.  Potassium 4.1.  BUN 12.  Creatinine 1.04.  Normal LFTs.  CTAP with contrast was unremarkable.  He received IV fluids, Morphine and Metoclopramide and his clinical status stabilized and he was discharged home.  He continued to have intermittent nausea and vomiting and upper abdominal pain for the next 2 to 3 days which abated without recurrence.  He denied having any viral symptoms prior to his EGD/colonoscopy date.  Dr. Rush Landmark recommended a RUQ sonogram to rule out gallstones, however, the patient did not wish to schedule this image study as his symptoms abated.  Currently, he denies having any abdominal pain.  His heartburn has significantly improved over the past 4 weeks.  He remains on Omeprazole 40 mg in the morning and Famotidine 20 mg at bedtime.  No dysphagia.  He is passing normal bowel movements most days.  No rectal bleeding or black stools.  He is taking Benefiber daily.  No other complaints at this time.  GI  PROCEDURES:  EGD 10/25/2021: - Esophageal mucosal changes of congestion noted - biopsied. - LA Grade A esophagitis with no bleeding in very distal esophagus. - Z-line irregular, 38 cm from the incisors. - 2 cm hiatal hernia. - Erythematous mucosa in the stomach. Biopsied. - No gross lesions in the duodenal bulb, in the first portion of the duodenum and in the second portion of the duodenum. Biopsied. - If issues persist and no evidence of EoE/LoE, then recommend consideration of pH impedence testing + Manometry in the future (should we ever consider role of HH repair and fundoplication).  Colonoscopy 10/25/2021: - Hemorrhoids found on digital rectal exam. - There was significant looping of the colon. - Normal mucosa in the entire examined colon. Biopsied. - Non-bleeding non-thrombosed external and internal hemorrhoids. - Normal mucosa in the entire examined colon. - Next colonoscopy for colon cancer screening due at the age of 68.  Path report: 1. Surgical [P], colon nos, random sites - GASTRIC ANTRAL AND OXYNTIC MUCOSA WITH NO SPECIFIC HISTOPATHOLOGIC CHANGES - HELICOBACTER PYLORI-LIKE ORGANISMS ARE NOT IDENTIFIED ON ROUTINE H&E STAIN 2. Surgical [P], duodenal - DUODENAL MUCOSA WITH NO SPECIFIC HISTOPATHOLOGIC CHANGES - NEGATIVE FOR INCREASED INTRAEPITHELIAL LYMPHOCYTES OR VILLOUS ARCHITECTURAL CHANGES 3. Surgical [P], esophageal - ESOPHAGEAL SQUAMOUS MUCOSA WITH MILD VASCULAR CONGESTION, AND FOCAL SQUAMOUS BALLOONING, SUGGESTIVE OF REFLUX ESOPHAGITIS - NEGATIVE FOR INCREASED INTRAEPITHELIAL EOSINOPHILS 4. Surgical [P], colon nos, random sites - COLONIC MUCOSA WITH NO SPECIFIC HISTOPATHOLOGIC CHANGES - NEGATIVE FOR ACUTE INFLAMMATION, INCREASED INTRAEPITHELIAL LYMPHOCYTES OR THICKENED SUBEPITHELIAL COLLAGEN TABLE  IMAGE  STUDIES:  CTAP 10/25/2021: CLINICAL DATA:  Left upper quadrant pain, nausea, vomiting after endoscopy/colonoscopy today.   EXAM: CT ABDOMEN AND PELVIS WITH  CONTRAST   TECHNIQUE: Multidetector CT imaging of the abdomen and pelvis was performed using the standard protocol following bolus administration of intravenous contrast.   RADIATION DOSE REDUCTION: This exam was performed according to the departmental dose-optimization program which includes automated exposure control, adjustment of the mA and/or kV according to patient size and/or use of iterative reconstruction technique.   CONTRAST:  110mL OMNIPAQUE IOHEXOL 300 MG/ML  SOLN   COMPARISON:  None Available.   FINDINGS: Lower chest: No acute abnormality   Hepatobiliary: No focal hepatic abnormality. Gallbladder unremarkable.   Pancreas: No focal abnormality or ductal dilatation.   Spleen: No focal abnormality.  Normal size.   Adrenals/Urinary Tract: No adrenal abnormality. No focal renal abnormality. No stones or hydronephrosis. Urinary bladder is unremarkable.   Stomach/Bowel: Stomach, large and small bowel grossly unremarkable.   Vascular/Lymphatic: No evidence of aneurysm or adenopathy.   Reproductive: No visible focal abnormality.   Other: No free fluid or free air.   Musculoskeletal: No acute bony abnormality.   IMPRESSION: No acute findings in the abdomen or pelvis.       Latest Ref Rng & Units 10/26/2021   12:16 AM 10/19/2021    3:15 PM  CBC  WBC 4.0 - 10.5 K/uL 17.4  11.2   Hemoglobin 13.0 - 17.0 g/dL 15.8  15.4   Hematocrit 39.0 - 52.0 % 47.9  46.3   Platelets 150 - 400 K/uL 272  248.0        Latest Ref Rng & Units 10/26/2021   12:16 AM 10/19/2021    3:15 PM  CMP  Glucose 70 - 99 mg/dL 132  73   BUN 6 - 20 mg/dL 12  21   Creatinine 0.61 - 1.24 mg/dL 1.04  1.29   Sodium 135 - 145 mmol/L 139  137   Potassium 3.5 - 5.1 mmol/L 4.1  4.2   Chloride 98 - 111 mmol/L 105  101   CO2 22 - 32 mmol/L 23  28   Calcium 8.9 - 10.3 mg/dL 10.0  9.9   Total Protein 6.5 - 8.1 g/dL 8.5  7.7   Total Bilirubin 0.3 - 1.2 mg/dL 0.9  0.5   Alkaline Phos 38 - 126 U/L 48   49   AST 15 - 41 U/L 29  23   ALT 0 - 44 U/L 19  16     Current Outpatient Medications on File Prior to Visit  Medication Sig Dispense Refill   famotidine (PEPCID) 20 MG tablet Take 1 tablet (20 mg total) by mouth at bedtime. 30 tablet 2   omeprazole (PRILOSEC) 40 MG capsule Take 1 capsule (40 mg total) by mouth daily. Take 30 minutes before breakfast. 30 capsule 2   No current facility-administered medications on file prior to visit.   No Known Allergies  Current Medications, Allergies, Past Medical History, Past Surgical History, Family History and Social History were reviewed in [employer] record.  Review of Systems:   Constitutional: Negative for fever, sweats, chills or weight loss.  Respiratory: Negative for shortness of breath.   Cardiovascular: Negative for chest pain, palpitations and leg swelling.  Gastrointestinal: See HPI.  Musculoskeletal: Negative for back pain or muscle aches.  Neurological: Negative for dizziness, headaches or paresthesias.   Physical Exam: BP 126/78   Pulse 70   Ht 6' (1.829 m)  Wt 247 lb 3.2 oz (112.1 kg)   SpO2 98%   BMI 33.53 kg/m  General: 31 year old male in no acute distress. Head: Normocephalic and atraumatic. Eyes: No scleral icterus. Conjunctiva pink . Ears: Normal auditory acuity. Lungs: Clear throughout to auscultation. Heart: Regular rate and rhythm, no murmur. Abdomen: Soft, nontender and nondistended. No masses or hepatomegaly. Normal bowel sounds x 4 quadrants.  Rectal: Deferred. Musculoskeletal: Symmetrical with no gross deformities. Extremities: No edema. Neurological: Alert oriented x 4. No focal deficits.  Psychological: Alert and cooperative. Normal mood and affect  Assessment and Recommendations:  43) 31 year old male with mild reflux esophagitis per EGD 10/25/2021 -Continue Omeprazole 40 mg p.o. daily.  Patient to contact me in 2 months and we will discuss weaning down PPI if he remains  symptom free -Continue Famotidine 20 mg nightly -Maintain a GERD diet  2) Chronic diarrhea, improved after taking Benefiber. Colonoscopy 10/25/2021 was normal, no evidence of microscopic colitis or IBD. -Patient to contact office if diarrhea recurs  3) Post EGD/colonoscopy N/V and upper abdominal pain, etiology unclear. Unlikely adverse response to Propofol.  Labs in the ED showed a leukocytosis with a WBC count of 17.  His electrolytes and renal function were normal. CTAP was unrevealing.  RUQ sonogram was ordered to rule out gallstones, however, the patient did not wish to schedule this image study as his symptoms abated. -RUQ sonogram if nausea, vomiting and/or upper abdominal pain recurs -Recommend IV fluids and antiemetic prior to any future procedure with sedation/anesthesia  4) Colon cancer screening -Screening colonoscopy due at the age of 62

## 2021-12-06 ENCOUNTER — Other Ambulatory Visit (INDEPENDENT_AMBULATORY_CARE_PROVIDER_SITE_OTHER): Payer: BC Managed Care – PPO

## 2021-12-06 ENCOUNTER — Encounter: Payer: Self-pay | Admitting: Nurse Practitioner

## 2021-12-06 ENCOUNTER — Ambulatory Visit (INDEPENDENT_AMBULATORY_CARE_PROVIDER_SITE_OTHER): Payer: BC Managed Care – PPO | Admitting: Nurse Practitioner

## 2021-12-06 VITALS — BP 126/78 | HR 70 | Ht 72.0 in | Wt 247.2 lb

## 2021-12-06 DIAGNOSIS — R197 Diarrhea, unspecified: Secondary | ICD-10-CM | POA: Diagnosis not present

## 2021-12-06 DIAGNOSIS — K2100 Gastro-esophageal reflux disease with esophagitis, without bleeding: Secondary | ICD-10-CM

## 2021-12-06 DIAGNOSIS — D72829 Elevated white blood cell count, unspecified: Secondary | ICD-10-CM

## 2021-12-06 LAB — CBC WITH DIFFERENTIAL/PLATELET
Basophils Absolute: 0.1 10*3/uL (ref 0.0–0.1)
Basophils Relative: 1.2 % (ref 0.0–3.0)
Eosinophils Absolute: 0.2 10*3/uL (ref 0.0–0.7)
Eosinophils Relative: 3.4 % (ref 0.0–5.0)
HCT: 43.9 % (ref 39.0–52.0)
Hemoglobin: 14.6 g/dL (ref 13.0–17.0)
Lymphocytes Relative: 24 % (ref 12.0–46.0)
Lymphs Abs: 1.7 10*3/uL (ref 0.7–4.0)
MCHC: 33.2 g/dL (ref 30.0–36.0)
MCV: 92.8 fl (ref 78.0–100.0)
Monocytes Absolute: 0.6 10*3/uL (ref 0.1–1.0)
Monocytes Relative: 8 % (ref 3.0–12.0)
Neutro Abs: 4.4 10*3/uL (ref 1.4–7.7)
Neutrophils Relative %: 63.4 % (ref 43.0–77.0)
Platelets: 209 10*3/uL (ref 150.0–400.0)
RBC: 4.73 Mil/uL (ref 4.22–5.81)
RDW: 13.2 % (ref 11.5–15.5)
WBC: 7 10*3/uL (ref 4.0–10.5)

## 2021-12-06 NOTE — Patient Instructions (Addendum)
Your provider has requested that you go to the basement level for lab work before leaving today. Press "B" on the elevator. The lab is located at the first door on the left as you exit the elevator.   Continue Omeprazole 40mg  once daily for the next 2 months. Contac our office mid December for further Omeprazole instructions.   Continue Famotidine 20mg  one tab at bed time.  It was a pleasure to see you today!  Thank you for trusting me with your gastrointestinal care!

## 2021-12-06 NOTE — Progress Notes (Signed)
Attending Physician's Attestation   I have reviewed the chart.   I agree with the Advanced Practitioner's note, impression, and recommendations with any updates as below. Glad to hear that he is doing better.  Etiology of his postprocedural nausea/vomiting/abdominal pain remains unclear.   Justice Britain, MD [facility] Advanced Endoscopy Office # [phone]

## 2021-12-14 ENCOUNTER — Ambulatory Visit (INDEPENDENT_AMBULATORY_CARE_PROVIDER_SITE_OTHER): Payer: BC Managed Care – PPO | Admitting: Psychology

## 2021-12-14 DIAGNOSIS — Z600 Problems of adjustment to life-cycle transitions: Secondary | ICD-10-CM | POA: Diagnosis not present

## 2021-12-14 DIAGNOSIS — F320 Major depressive disorder, single episode, mild: Secondary | ICD-10-CM

## 2021-12-14 NOTE — Progress Notes (Signed)
PROGRESS NOTE:   Name: [name] Date: 12/14/2021 MRN: [id number] DOB: September 05, 1990 PCP: Hoyt Koch, MD  Time spent: 1:00 - 1:55 PM  In session today I met with [name] in office face-to-face in person individual psychotherapy.    In session, we created [name]'s treatment plan.  We d/ his needs and set goals.  He actively participated in the creation of his treatment plan and freely gave his consent.    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  He states that his stress is creating physical/medical problems and in his relationships.  [name] broke up with his girlfriend in November and they have been talking on and off.  He is hopeful that they will get back together again but he's afraid to ask if she does.    His parents divorced two years ago.  They waited until their boys were our of school.  Older sister [name] (39) lives in [place] and has three kids who are in [place].  His sister is divorced but in a long term relationship.  Younger brother, [name] who lives in [place] and works for [employer] and is in it as a Designer, jewellery.  [name] came out as bi-sexual and his father is not on board.  [name]'s relationship with his dad got better once he went to college.  Growing up he was closer to his mom, but after the divorce he has a lot of resentment towards her.  She is constantly cutting his father down when he is with her.     Mental Status Exam: Appearance:   Fairly Groomed     Behavior:  Appropriate  Motor:  Normal  Speech/Language:   NA  Affect:  Depressed  Mood:  normal  Thought process:  normal  Thought content:    WNL  Sensory/Perceptual disturbances:    WNL  Orientation:  oriented to person, place, time/date, and situation  Attention:  NA  Concentration:  Good  Memory:  WNL  Fund of knowledge:   Good  Insight:    Good  Judgment:   Good  Impulse Control:  Good     Individualized Treatment Plan       Strengths: bright, verbal, reflective, resourceful  Supports: family, supportive friendships   Goal/Needs for Treatment:  In order of importance to patient 1) Learn and implement skills and strategies to reduce stress 2) Learn and implement skills and strategies to reduce anxiety 3) Learn and implement skills and strategies to reduce depression   Client Statement of Needs: better understanding of how to mange stress, learn to be more mindful, learn how to handle problems instead of panicking   Treatment Level: Weekly Outpatient Individual Psychotherapy  Symptoms: feeling down, fatigue, constant worry, episodes of panic,   Client Treatment Preferences: none   Healthcare consumer's goal for treatment:  Psychologist, [name], Ph.D. will support the patient's ability to achieve the goals identified. Cognitive Behavioral Therapy, Dialectical Behavioral Therapy, Motivational Interviewing, behavior activation and other evidenced-based practices will be used to promote progress towards healthy functioning.   Healthcare consumer [name] will: Actively participate in therapy, working towards healthy functioning.    *Justification for Continuation/Discontinuation of Goal: R=Revised, O=Ongoing, A=Achieved, D=Discontinued  Goal 1) Learn and implement skills and strategies to reduce stress  5 Point Likert rating baseline date: 12/14/21 Target Date Goal Was reviewed Status Code Progress towards goal/Likert rating  12/15/22  O              Goal 2) Learn and implement skills and strategies to reduce anxiety  5 Point Likert rating baseline date: 12/14/21 Target Date Goal Was reviewed Status Code Progress towards goal/Likert rating  12/15/22           O              Goal 3) Learn and implement skills and strategies to reduce depression  5 Point Likert rating baseline date: 12/14/21 Target Date Goal Was reviewed Status Code  Progress towards goal/Likert rating  12/15/22           O              This plan has been reviewed and created by the following participants:  This plan will be reviewed at least every 12 months. Date Behavioral Health Clinician Date Guardian/Patient   12/14/21  [name], Ph.D.   12/14/21 [name]                   Diagnoses:  Major Depressive Disorder, single episode, mild Phase of Life Problem   [name], PhD

## 2021-12-28 ENCOUNTER — Ambulatory Visit (INDEPENDENT_AMBULATORY_CARE_PROVIDER_SITE_OTHER): Payer: BC Managed Care – PPO | Admitting: Psychology

## 2021-12-28 DIAGNOSIS — F320 Major depressive disorder, single episode, mild: Secondary | ICD-10-CM | POA: Diagnosis not present

## 2021-12-28 NOTE — Progress Notes (Signed)
PROGRESS NOTE:   Name: Saifullah Jolley Date: 12/28/2021 MRN: [id number] DOB: 1990-03-26 PCP: Hoyt Koch, MD  Time spent: 1:00 - 1:55 PM  In session today I met with [name] in office face-to-face in person individual psychotherapy.    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  He states that his stress is creating physical/medical problems and in his relationships.  Marrio broke up with his girlfriend in November and they have been talking on and off.  He is hopeful that they will get back together again but he's afraid to ask if she does.    His parents divorced two years ago.  They waited until their boys were our of school.  Older sister [name] (39) lives in [place] and has three kids who are in [place].  His sister is divorced but in a long term relationship.  Younger brother, [name] who lives in [place] and works for [employer] and is in it as a Designer, jewellery.  [name] came out as bi-sexual and his father is not on board.  Marselino's relationship with his dad got better once he went to college.  Growing up he was closer to his mom, but after the divorce he has a lot of resentment towards her.  She is constantly cutting his father down when he is with her.     Mental Status Exam: Appearance:   Fairly Groomed     Behavior:  Appropriate  Motor:  Normal  Speech/Language:   NA  Affect:  Depressed  Mood:  normal  Thought process:  normal  Thought content:    WNL  Sensory/Perceptual disturbances:    WNL  Orientation:  oriented to person, place, time/date, and situation  Attention:  NA  Concentration:  Good  Memory:  WNL  Fund of knowledge:   Good  Insight:    Good  Judgment:   Good  Impulse Control:  Good    Individualized Treatment  Plan       Strengths: bright, verbal, reflective, resourceful  Supports: family, supportive friendships   Goal/Needs for Treatment:  In order of importance to patient 1) Learn and implement skills and strategies to reduce stress 2) Learn and implement skills and strategies to reduce anxiety 3) Learn and implement skills and strategies to reduce depression   Client Statement of Needs: better understanding of how to mange stress, learn to be more mindful, learn how to handle problems instead of panicking   Treatment Level: Weekly Outpatient Individual Psychotherapy  Symptoms: feeling down, fatigue, constant worry, episodes of panic,   Client Treatment Preferences: none   Healthcare consumer's goal for treatment:  Psychologist, [name], Ph.D. will support the patient's ability to achieve the goals identified. Cognitive Behavioral Therapy, Dialectical Behavioral Therapy, Motivational Interviewing, behavior activation and other evidenced-based practices will be used to promote progress towards healthy functioning.   Healthcare consumer Brecken Dewoody will: Actively participate in therapy, working towards healthy functioning.    *Justification for Continuation/Discontinuation of Goal: R=Revised, O=Ongoing, A=Achieved, D=Discontinued  Goal 1) Learn and implement skills and strategies to reduce stress  5 Point Likert rating baseline date: 12/14/21 Target Date Goal Was reviewed Status Code Progress towards goal/Likert rating  12/15/22          O  Goal 2) Learn and implement skills and strategies to reduce anxiety  5 Point Likert rating baseline date: 12/14/21 Target Date Goal Was reviewed Status Code Progress towards goal/Likert rating  12/15/22           O              Goal 3) Learn and implement skills and strategies to reduce depression  5 Point Likert rating baseline date: 12/14/21 Target Date Goal Was reviewed Status Code Progress towards goal/Likert rating   12/15/22           O              This plan has been reviewed and created by the following participants:  This plan will be reviewed at least every 12 months. Date Behavioral Health Clinician Date Guardian/Patient   12/14/21  [name], Ph.D.   12/14/21 [name]                   Diagnoses:  Major Depressive Disorder, single episode, mild Phase of Life Problem  Adrean reports that he f/t and he did a better job of communicating with his manager and she responded well.  Nevertheless, he admits that on Sunday he begins to feel anxious about the work week and on Monday mornings he dreads going into work.  I made a number of inquires.  We identified and d/e/p a number of factors which contribute to his feelings of dread.  I provided assertiveness training and we p/s approaches to address a few of the identified factors.   [name], PhD

## 2022-01-11 ENCOUNTER — Ambulatory Visit (INDEPENDENT_AMBULATORY_CARE_PROVIDER_SITE_OTHER): Payer: BC Managed Care – PPO | Admitting: Psychology

## 2022-01-11 DIAGNOSIS — F320 Major depressive disorder, single episode, mild: Secondary | ICD-10-CM | POA: Diagnosis not present

## 2022-01-11 NOTE — Progress Notes (Signed)
PROGRESS NOTE:   Name: [name] Date: 01/11/2022 MRN: [id number] DOB: [birth year] PCP: Hoyt Koch, MD  Time spent: 1:00 - 1:55 PM  In session today I met with [name] in office face-to-face in person individual psychotherapy.    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  He states that his stress is creating physical/medical problems and in his relationships.  [name] broke up with his girlfriend in November and they have been talking on and off.  He is hopeful that they will get back together again but he's afraid to ask if she does.    His parents divorced two years ago.  They waited until their boys were our of school.  Older sister [name] (39) lives in [place] and has three kids who are in [place].  His sister is divorced but in a long term relationship.  Younger brother, [name] who lives in [place] and works for [employer] and is in it as a Designer, jewellery.  [name] came out as bi-sexual and his father is not on board.  [name]'s relationship with his dad got better once he went to college.  Growing up he was closer to his mom, but after the divorce he has a lot of resentment towards her.  She is constantly cutting his father down when he is with her.     Mental Status Exam: Appearance:   Fairly Groomed     Behavior:  Appropriate  Motor:  Normal  Speech/Language:   NA  Affect:  Depressed  Mood:  normal  Thought process:  normal  Thought content:    WNL  Sensory/Perceptual disturbances:    WNL  Orientation:  oriented to person, place, time/date, and situation  Attention:  NA  Concentration:  Good  Memory:  WNL  Fund of knowledge:   Good  Insight:    Good  Judgment:   Good  Impulse Control:  Good    Individualized Treatment  Plan       Strengths: bright, verbal, reflective, resourceful  Supports: family, supportive friendships   Goal/Needs for Treatment:  In order of importance to patient 1) Learn and implement skills and strategies to reduce stress 2) Learn and implement skills and strategies to reduce anxiety 3) Learn and implement skills and strategies to reduce depression   Client Statement of Needs: better understanding of how to mange stress, learn to be more mindful, learn how to handle problems instead of panicking   Treatment Level: Weekly Outpatient Individual Psychotherapy  Symptoms: feeling down, fatigue, constant worry, episodes of panic,   Client Treatment Preferences: none   Healthcare consumer's goal for treatment:  Psychologist, [name], Ph.D. will support the patient's ability to achieve the goals identified. Cognitive Behavioral Therapy, Dialectical Behavioral Therapy, Motivational Interviewing, behavior activation and other evidenced-based practices will be used to promote progress towards healthy functioning.   Healthcare consumer Kip Cropp will: Actively participate in therapy, working towards healthy functioning.    *Justification for Continuation/Discontinuation of Goal: R=Revised, O=Ongoing, A=Achieved, D=Discontinued  Goal 1) Learn and implement skills and strategies to reduce stress  5 Point Likert rating baseline date: 12/14/21 Target Date Goal Was reviewed Status Code Progress towards goal/Likert rating  12/15/22          O  Goal 2) Learn and implement skills and strategies to reduce anxiety  5 Point Likert rating baseline date: 12/14/21 Target Date Goal Was reviewed Status Code Progress towards goal/Likert rating  12/15/22           O              Goal 3) Learn and implement skills and strategies to reduce depression  5 Point Likert rating baseline date: 12/14/21 Target Date Goal Was reviewed Status Code Progress towards goal/Likert rating   12/15/22           O              This plan has been reviewed and created by the following participants:  This plan will be reviewed at least every 12 months. Date Behavioral Health Clinician Date Guardian/Patient   12/14/21  [name], Ph.D.   12/14/21 [name]                   Diagnoses:  Major Depressive Disorder, single episode, mild Phase of Life Problem  [name] reports that he had a great time in [place] visiting with his mom's family.  His G/I problems stopped while he was away and returned on Monday morning when he returned to work.  This opened the door to d/ whether he needed a change of career.  For the remainder of the session we d/e/p what he wants in the future, possible pathways and decisions to be made.   [name], PhD

## 2022-01-16 ENCOUNTER — Ambulatory Visit (INDEPENDENT_AMBULATORY_CARE_PROVIDER_SITE_OTHER): Payer: BC Managed Care – PPO | Admitting: Psychology

## 2022-01-16 DIAGNOSIS — F320 Major depressive disorder, single episode, mild: Secondary | ICD-10-CM

## 2022-01-16 NOTE — Progress Notes (Unsigned)
PROGRESS NOTE:   Name: [name] Date: 01/16/2022 MRN: [id number] DOB: 11/21/1990 PCP: Hoyt Koch, MD  Time spent: 1:00 - 1:55 PM  In session today I met with [name] in office face-to-face in person individual psychotherapy.    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  He states that his stress is creating physical/medical problems and in his relationships.  [name] broke up with his girlfriend in November and they have been talking on and off.  He is hopeful that they will get back together again but he's afraid to ask if she does.    His parents divorced two years ago.  They waited until their boys were our of school.  Older sister [name] (39) lives in [place] and has three kids who are in [place].  His sister is divorced but in a long term relationship.  Younger brother, [name] who lives in [place] and works for [employer] and is in it as a Designer, jewellery.  [name] came out as bi-sexual and his father is not on board.  [name]'s relationship with his dad got better once he went to college.  Growing up he was closer to his mom, but after the divorce he has a lot of resentment towards her.  She is constantly cutting his father down when he is with her.     Mental Status Exam: Appearance:   Fairly Groomed     Behavior:  Appropriate  Motor:  Normal  Speech/Language:   NA  Affect:  Depressed  Mood:  normal  Thought process:  normal  Thought content:    WNL  Sensory/Perceptual disturbances:    WNL  Orientation:  oriented to person, place, time/date, and situation  Attention:  NA  Concentration:  Good  Memory:  WNL  Fund of knowledge:   Good  Insight:    Good  Judgment:   Good  Impulse Control:  Good    Individualized Treatment  Plan       Strengths: bright, verbal, reflective, resourceful  Supports: family, supportive friendships   Goal/Needs for Treatment:  In order of importance to patient 1) Learn and implement skills and strategies to reduce stress 2) Learn and implement skills and strategies to reduce anxiety 3) Learn and implement skills and strategies to reduce depression   Client Statement of Needs: better understanding of how to mange stress, learn to be more mindful, learn how to handle problems instead of panicking   Treatment Level: Weekly Outpatient Individual Psychotherapy  Symptoms: feeling down, fatigue, constant worry, episodes of panic,   Client Treatment Preferences: none   Healthcare consumer's goal for treatment:  Psychologist, [name], Ph.D. will support the patient's ability to achieve the goals identified. Cognitive Behavioral Therapy, Dialectical Behavioral Therapy, Motivational Interviewing, behavior activation and other evidenced-based practices will be used to promote progress towards healthy functioning.   Healthcare consumer [name] will: Actively participate in therapy, working towards healthy functioning.    *Justification for Continuation/Discontinuation of Goal: R=Revised, O=Ongoing, A=Achieved, D=Discontinued  Goal 1) Learn and implement skills and strategies to reduce stress  5 Point Likert rating baseline date: 12/14/21 Target Date Goal Was reviewed Status Code Progress towards goal/Likert rating  12/15/22          O  Goal 2) Learn and implement skills and strategies to reduce anxiety  5 Point Likert rating baseline date: 12/14/21 Target Date Goal Was reviewed Status Code Progress towards goal/Likert rating  12/15/22           O              Goal 3) Learn and implement skills and strategies to reduce depression  5 Point Likert rating baseline date: 12/14/21 Target Date Goal Was reviewed Status Code Progress towards goal/Likert rating   12/15/22           O              This plan has been reviewed and created by the following participants:  This plan will be reviewed at least every 12 months. Date Behavioral Health Clinician Date Guardian/Patient   12/14/21  [name], Ph.D.   12/14/21 [name]                   Diagnoses:  Major Depressive Disorder, single episode, mild Phase of Life Problem  [name] reports that he was home sick today.  We continued our conversation about his possible move down to [place].  He spoke with his cousin and it looks like there was another job open that looked right for him.  We d/e/p his decision, his anxieties, and its impact on his family and friends.    [name] states that he spoke with his mother this past week and was very frustrated.  We d/e/p that he feels like the roles are reversed where he is more like the parent.  Other times she treats him like he is more like a child.  We d/e/p other family dynamics and how he finds refuge in his relationship with his father.   [name], [name]

## 2022-01-18 ENCOUNTER — Ambulatory Visit: Payer: BC Managed Care – PPO | Admitting: Psychology

## 2022-01-18 NOTE — Addendum Note (Signed)
Addended by: Marin Olp ANN on: 01/18/2022 02:09 PM   Modules accepted: Level of Service

## 2022-01-25 ENCOUNTER — Ambulatory Visit (INDEPENDENT_AMBULATORY_CARE_PROVIDER_SITE_OTHER): Payer: BC Managed Care – PPO | Admitting: Psychology

## 2022-01-25 DIAGNOSIS — F320 Major depressive disorder, single episode, mild: Secondary | ICD-10-CM | POA: Diagnosis not present

## 2022-01-25 NOTE — Progress Notes (Signed)
PROGRESS NOTE:   Name: [name] Date: 01/25/2022 MRN: [id number] DOB: 08/09/1990 PCP: Hoyt Koch, MD  Time spent: 1:00 - 1:55 PM  In session today I met with [name] in office face-to-face in person individual psychotherapy.    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  He states that his stress is creating physical/medical problems and in his relationships.  Jerzy broke up with his girlfriend in November and they have been talking on and off.  He is hopeful that they will get back together again but he's afraid to ask if she does.    His parents divorced two years ago.  They waited until their boys were our of school.  Older sister [name] (39) lives in [place] and has three kids who are in [place].  His sister is divorced but in a long term relationship.  Younger brother, [name] who lives in [place] and works for [employer] and is in it as a Designer, jewellery.  [name] came out as bi-sexual and his father is not on board.  [name]'s relationship with his dad got better once he went to college.  Growing up he was closer to his mom, but after the divorce he has a lot of resentment towards her.  She is constantly cutting his father down when he is with her.     Mental Status Exam: Appearance:   Fairly Groomed     Behavior:  Appropriate  Motor:  Normal  Speech/Language:   NA  Affect:  Depressed  Mood:  normal  Thought process:  normal  Thought content:    WNL  Sensory/Perceptual disturbances:    WNL  Orientation:  oriented to person, place, time/date, and situation  Attention:  NA  Concentration:  Good  Memory:  WNL  Fund of knowledge:   Good  Insight:    Good  Judgment:   Good  Impulse Control:  Good    Individualized Treatment  Plan       Strengths: bright, verbal, reflective, resourceful  Supports: family, supportive friendships   Goal/Needs for Treatment:  In order of importance to patient 1) Learn and implement skills and strategies to reduce stress 2) Learn and implement skills and strategies to reduce anxiety 3) Learn and implement skills and strategies to reduce depression   Client Statement of Needs: better understanding of how to mange stress, learn to be more mindful, learn how to handle problems instead of panicking   Treatment Level: Weekly Outpatient Individual Psychotherapy  Symptoms: feeling down, fatigue, constant worry, episodes of panic,   Client Treatment Preferences: none   Healthcare consumer's goal for treatment:  Psychologist, [name], Ph.D. will support the patient's ability to achieve the goals identified. Cognitive Behavioral Therapy, Dialectical Behavioral Therapy, Motivational Interviewing, behavior activation and other evidenced-based practices will be used to promote progress towards healthy functioning.   Healthcare consumer Alontae Chaloux will: Actively participate in therapy, working towards healthy functioning.    *Justification for Continuation/Discontinuation of Goal: R=Revised, O=Ongoing, A=Achieved, D=Discontinued  Goal 1) Learn and implement skills and strategies to reduce stress  5 Point Likert rating baseline date: 12/14/21 Target Date Goal Was reviewed Status Code Progress towards goal/Likert rating  12/15/22          O  Goal 2) Learn and implement skills and strategies to reduce anxiety  5 Point Likert rating baseline date: 12/14/21 Target Date Goal Was reviewed Status Code Progress towards goal/Likert rating  12/15/22           O              Goal 3) Learn and implement skills and strategies to reduce depression  5 Point Likert rating baseline date: 12/14/21 Target Date Goal Was reviewed Status Code Progress towards goal/Likert rating   12/15/22           O              This plan has been reviewed and created by the following participants:  This plan will be reviewed at least every 12 months. Date Behavioral Health Clinician Date Guardian/Patient   12/14/21  [name], Ph.D.   12/14/21 [name]                   Diagnoses:  Major Depressive Disorder, single episode, mild Phase of Life Problem  Cassie reports that he didn't get the job he wanted in [place].  While he was disappointed, things are going well at work and the pressure is off to make a quick change.  We d/ how he continues to work on his communication with his supervisor and how he is seeing it make a change in his job satisfaction.  He identified an area he continues to have trouble with and we d/e/p the situation.  We were then able to consider some addition strategies to help him more positively anticipate going to work.    [name], PhD

## 2022-02-01 ENCOUNTER — Ambulatory Visit: Payer: BC Managed Care – PPO | Admitting: Psychology

## 2022-02-08 ENCOUNTER — Ambulatory Visit: Payer: BC Managed Care – PPO | Admitting: Psychology

## 2022-02-15 ENCOUNTER — Ambulatory Visit: Payer: BC Managed Care – PPO | Admitting: Psychology

## 2022-02-22 ENCOUNTER — Ambulatory Visit (INDEPENDENT_AMBULATORY_CARE_PROVIDER_SITE_OTHER): Payer: BC Managed Care – PPO | Admitting: Psychology

## 2022-02-22 DIAGNOSIS — F411 Generalized anxiety disorder: Secondary | ICD-10-CM

## 2022-02-22 NOTE — Progress Notes (Signed)
PROGRESS NOTE:   Name: [name] Date: 02/22/2022 MRN: [id number] DOB: 25-Feb-1990 PCP: Randy Koch, MD  Time spent: 1:00 - 1:55 PM  In session today I met with [name] in office face-to-face in person individual psychotherapy.    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  He states that his stress is creating physical/medical problems and in his relationships.  [name] broke up with his girlfriend in November and they have been talking on and off.  He is hopeful that they will get back together again but he's afraid to ask if she does.    His parents divorced two years ago.  They waited until their boys were our of school.  Older sister [name] (39) lives in [place] and has three kids who are in [place].  His sister is divorced but in a long term relationship.  Younger brother, [name] who lives in [place] and works for [employer] and is in it as a Designer, jewellery.  [name] came out as bi-sexual and his father is not on board.  [name]'s relationship with his dad got better once he went to college.  Growing up he was closer to his mom, but after the divorce he has a lot of resentment towards her.  She is constantly cutting his father down when he is with her.     Mental Status Exam: Appearance:   Fairly Groomed     Behavior:  Appropriate  Motor:  Normal  Speech/Language:   NA  Affect:  Depressed  Mood:  normal  Thought process:  normal  Thought content:    WNL  Sensory/Perceptual disturbances:    WNL  Orientation:  oriented to person, place, time/date, and situation  Attention:  NA  Concentration:  Good  Memory:  WNL  Fund of knowledge:   Good  Insight:    Good  Judgment:   Good  Impulse Control:  Good    Individualized Treatment  Plan       Strengths: bright, verbal, reflective, resourceful  Supports: family, supportive friendships   Goal/Needs for Treatment:  In order of importance to patient 1) Learn and implement skills and strategies to reduce stress 2) Learn and implement skills and strategies to reduce anxiety 3) Learn and implement skills and strategies to reduce depression   Client Statement of Needs: better understanding of how to mange stress, learn to be more mindful, learn how to handle problems instead of panicking   Treatment Level: Weekly Outpatient Individual Psychotherapy  Symptoms: feeling down, fatigue, constant worry, episodes of panic,   Client Treatment Preferences: none   Healthcare consumer's goal for treatment:  Psychologist, [name], Ph.D. will support the patient's ability to achieve the goals identified. Cognitive Behavioral Therapy, Dialectical Behavioral Therapy, Motivational Interviewing, behavior activation and other evidenced-based practices will be used to promote progress towards healthy functioning.   Healthcare consumer [name] will: Actively participate in therapy, working towards healthy functioning.    *Justification for Continuation/Discontinuation of Goal: R=Revised, O=Ongoing, A=Achieved, D=Discontinued  Goal 1) Learn and implement skills and strategies to reduce stress  5 Point Likert rating baseline date: 12/14/21 Target Date Goal Was reviewed Status Code Progress towards goal/Likert rating  12/15/22          O  Goal 2) Learn and implement skills and strategies to reduce anxiety  5 Point Likert rating baseline date: 12/14/21 Target Date Goal Was reviewed Status Code Progress towards goal/Likert rating  12/15/22           O              Goal 3) Learn and implement skills and strategies to reduce depression  5 Point Likert rating baseline date: 12/14/21 Target Date Goal Was reviewed Status Code Progress towards goal/Likert rating   12/15/22           O              This plan has been reviewed and created by the following participants:  This plan will be reviewed at least every 12 months. Date Behavioral Health Clinician Date Guardian/Patient   12/14/21  [name], Ph.D.   12/14/21 [name]                   Diagnoses:  Major Depressive Disorder, single episode, mild Phase of Life Problem  [name] reports that he was having a good week until today.  He changed his weekly meeting with his manager to Thursdays and things didn't go well.  We d/e/p where things go poorly and how he attempts to cope under stress.  We then d/ and identified his negative and anxious thoughts and how to challenge these thoughts during meeting with his manager  Lastly, we d/ possible options for better dealing with his stress at work.Randy Crochet, PhD

## 2022-03-01 ENCOUNTER — Ambulatory Visit (INDEPENDENT_AMBULATORY_CARE_PROVIDER_SITE_OTHER): Payer: BC Managed Care – PPO | Admitting: Psychology

## 2022-03-01 DIAGNOSIS — F320 Major depressive disorder, single episode, mild: Secondary | ICD-10-CM

## 2022-03-01 DIAGNOSIS — F411 Generalized anxiety disorder: Secondary | ICD-10-CM | POA: Diagnosis not present

## 2022-03-01 NOTE — Progress Notes (Signed)
PROGRESS NOTE:   Name: [name] Date: 03/01/2022 MRN: [id number] DOB: 17-Dec-1990 PCP: Hoyt Koch, MD   Time spent: 1:00 - 1:55 PM  In session today I met with [name] in office face-to-face in person individual psychotherapy.    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  He states that his stress is creating physical/medical problems and in his relationships.  [name] broke up with his girlfriend in November and they have been talking on and off.  He is hopeful that they will get back together again but he's afraid to ask if she does.    His parents divorced two years ago.  They waited until their boys were our of school.  Older sister [name] (39) lives in [place] and has three kids who are in [place].  His sister is divorced but in a long term relationship.  Younger brother, [name] who lives in [place] and works for [employer] and is in it as a Designer, jewellery.  [name] came out as bi-sexual and his father is not on board.  [name].  Growing up he was closer to his mom, but after the divorce he has a lot of resentment towards her.  She is constantly cutting his father down when he is with her.     Mental Status Exam: Appearance:   Fairly Groomed     Behavior:  Appropriate  Motor:  Normal  Speech/Language:   NA  Affect:  Depressed  Mood:  normal  Thought process:  normal  Thought content:    WNL  Sensory/Perceptual disturbances:    WNL  Orientation:  oriented to person, place, time/date, and situation  Attention:  NA  Concentration:  Good  Memory:  WNL  Fund of knowledge:   Good  Insight:    Good  Judgment:   Good  Impulse Control:  Good    Individualized Treatment Plan       Strengths: bright, verbal, reflective, resourceful  Supports: family, supportive friendships   Goal/Needs for Treatment:  In order of importance  to patient 1) Learn and implement skills and strategies to reduce stress 2) Learn and implement skills and strategies to reduce anxiety 3) Learn and implement skills and strategies to reduce depression   Client Statement of Needs: better understanding of how to mange stress, learn to be more mindful, learn how to handle problems instead of panicking   Treatment Level: Weekly Outpatient Individual Psychotherapy  Symptoms: feeling down, fatigue, constant worry, episodes of panic,   Client Treatment Preferences: none   Healthcare consumer's goal for treatment:  Psychologist, [name], Ph.D. will support the patient's ability to achieve the goals identified. Cognitive Behavioral Therapy, Dialectical Behavioral Therapy, Motivational Interviewing, behavior activation and other evidenced-based practices will be used to promote progress towards healthy functioning.   Healthcare consumer [name] will: Actively participate in therapy, working towards healthy functioning.    *Justification for Continuation/Discontinuation of Goal: R=Revised, O=Ongoing, A=Achieved, D=Discontinued  Goal 1) Learn and implement skills and strategies to reduce stress  5 Point Likert rating baseline date: 12/14/21 Target Date Goal Was reviewed Status Code Progress towards goal/Likert rating  12/15/22          O              Goal 2) Learn and implement skills and strategies to reduce  anxiety  5 Point Likert rating baseline date: 12/14/21 Target Date Goal Was reviewed Status Code Progress towards goal/Likert rating  12/15/22           O              Goal 3) Learn and implement skills and strategies to reduce depression  5 Point Likert rating baseline date: 12/14/21 Target Date Goal Was reviewed Status Code Progress towards goal/Likert rating  12/15/22           O              This plan has been reviewed and created by the following participants:  This plan will be reviewed at least every 12  months. Date Behavioral Health Clinician Date Guardian/Patient   12/14/21  [name], Ph.D.   12/14/21 [name]                   Diagnoses:  Major Depressive Disorder, single episode, mild Phase of Life Problem  [name] reports that he had a better week.  We d/ what he did different this week to help he manage his stress more effectively.  We talked about another recent situation with his manager and were able to continue to d/e/p additional coping strategies.  [name] states that his has been trying to "get out there again" and has been dating a woman for the last couple of months.  We d/e/p his ambivalence about getting attached and agreed to continue our d/ in future sessions.   [name], PhD

## 2022-03-08 ENCOUNTER — Ambulatory Visit (INDEPENDENT_AMBULATORY_CARE_PROVIDER_SITE_OTHER): Payer: BC Managed Care – PPO | Admitting: Psychology

## 2022-03-08 DIAGNOSIS — F411 Generalized anxiety disorder: Secondary | ICD-10-CM | POA: Diagnosis not present

## 2022-03-08 NOTE — Progress Notes (Signed)
PROGRESS NOTE:   Name: [name] Date: 03/08/2022 MRN: [id number] DOB: 1990-09-19 PCP: Hoyt Koch, MD   Time spent: 1:00 - 1:55 PM  In session today I met with [name] in office face-to-face in person individual psychotherapy.    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  He states that his stress is creating physical/medical problems and in his relationships.  [name] broke up with his girlfriend in November and they have been talking on and off.  He is hopeful that they will get back together again but he's afraid to ask if she does.    His parents divorced two years ago.  They waited until their boys were our of school.  Older sister [name] (39) lives in [place] and has three kids who are in [place].  His sister is divorced but in a long term relationship.  Younger brother, [name] who lives in [place] and works for [employer] and is in it as a Designer, jewellery.  [name] came out as bi-sexual and his father is not on board.  [name]'s relationship with his dad got better once he went to college.  Growing up he was closer to his mom, but after the divorce he has a lot of resentment towards her.  She is constantly cutting his father down when he is with her.     Mental Status Exam: Appearance:   Fairly Groomed     Behavior:  Appropriate  Motor:  Normal  Speech/Language:   NA  Affect:  Depressed  Mood:  normal  Thought process:  normal  Thought content:    WNL  Sensory/Perceptual disturbances:    WNL  Orientation:  oriented to person, place, time/date, and situation  Attention:  NA  Concentration:  Good  Memory:  WNL  Fund of knowledge:   Good  Insight:    Good  Judgment:   Good  Impulse Control:  Good    Individualized Treatment Plan       Strengths: bright, verbal, reflective, resourceful  Supports: family, supportive friendships   Goal/Needs for Treatment:  In order of importance  to patient 1) Learn and implement skills and strategies to reduce stress 2) Learn and implement skills and strategies to reduce anxiety 3) Learn and implement skills and strategies to reduce depression   Client Statement of Needs: better understanding of how to mange stress, learn to be more mindful, learn how to handle problems instead of panicking   Treatment Level: Weekly Outpatient Individual Psychotherapy  Symptoms: feeling down, fatigue, constant worry, episodes of panic,   Client Treatment Preferences: none   Healthcare consumer's goal for treatment:  Psychologist, [name], Ph.D. will support the patient's ability to achieve the goals identified. Cognitive Behavioral Therapy, Dialectical Behavioral Therapy, Motivational Interviewing, behavior activation and other evidenced-based practices will be used to promote progress towards healthy functioning.   Healthcare consumer [name] will: Actively participate in therapy, working towards healthy functioning.    *Justification for Continuation/Discontinuation of Goal: R=Revised, O=Ongoing, A=Achieved, D=Discontinued  Goal 1) Learn and implement skills and strategies to reduce stress  5 Point Likert rating baseline date: 12/14/21 Target Date Goal Was reviewed Status Code Progress towards goal/Likert rating  12/15/22          O              Goal 2) Learn and implement skills and strategies to reduce  anxiety  5 Point Likert rating baseline date: 12/14/21 Target Date Goal Was reviewed Status Code Progress towards goal/Likert rating  12/15/22           O              Goal 3) Learn and implement skills and strategies to reduce depression  5 Point Likert rating baseline date: 12/14/21 Target Date Goal Was reviewed Status Code Progress towards goal/Likert rating  12/15/22           O              This plan has been reviewed and created by the following participants:  This plan will be reviewed at least every 12  months. Date Behavioral Health Clinician Date Guardian/Patient   12/14/21  [name], Ph.D.   12/14/21 [name]                   Diagnoses:  Major Depressive Disorder, single episode, mild Phase of Life Problem  [name] reports that he had a long day.  He left his laptop at work for the night before and found that he was better able to not stress/think about work.  We d/e/p whether a new routine was possible and how it might reduce his work stress.  [name] also admitted that he was having a hard time getting to bed at a good hour as he was having [place].  We d/ the connection between take work stress home and wanting to extended his "good times" in the evenings.  I provided psycho education related to good sleep hygiene and we d/ how to incorporate better practices into his evening routine.   [name], PhD

## 2022-03-15 ENCOUNTER — Ambulatory Visit: Payer: BC Managed Care – PPO | Admitting: Psychology

## 2022-03-22 ENCOUNTER — Ambulatory Visit (INDEPENDENT_AMBULATORY_CARE_PROVIDER_SITE_OTHER): Payer: BC Managed Care – PPO | Admitting: Psychology

## 2022-03-22 DIAGNOSIS — F411 Generalized anxiety disorder: Secondary | ICD-10-CM | POA: Diagnosis not present

## 2022-03-22 NOTE — Progress Notes (Signed)
PROGRESS NOTE:   Name: Lorn Butcher Date: 03/22/2022 MRN: [id number] DOB: [birth year] PCP: Hoyt Koch, MD   Time spent: 1:00 - 1:55 PM  In session today I met with [name] in office face-to-face in person individual psychotherapy.    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  He states that his stress is creating physical/medical problems and in his relationships.  Kalif broke up with his girlfriend in November and they have been talking on and off.  He is hopeful that they will get back together again but he's afraid to ask if she does.    His parents divorced two years ago.  They waited until their boys were our of school.  Older sister [name] (39) lives in [place] and has three kids who are in [place].  His sister is divorced but in a long term relationship.  Younger brother, [name] who lives in [place] and works for [employer] and is in it as a Designer, jewellery.  [name] came out as bi-sexual and his father is not on board.  Sidharth's relationship with his dad got better once he went to college.  Growing up he was closer to his mom, but after the divorce he has a lot of resentment towards her.  She is constantly cutting his father down when he is with her.     Mental Status Exam: Appearance:   Fairly Groomed     Behavior:  Appropriate  Motor:  Normal  Speech/Language:   NA  Affect:  Depressed  Mood:  normal  Thought process:  normal  Thought content:    WNL  Sensory/Perceptual disturbances:    WNL  Orientation:  oriented to person, place, time/date, and situation  Attention:  NA  Concentration:  Good  Memory:  WNL  Fund of knowledge:   Good  Insight:    Good  Judgment:   Good  Impulse Control:  Good    Individualized Treatment Plan       Strengths: bright, verbal, reflective, resourceful  Supports: family, supportive friendships   Goal/Needs for Treatment:  In order of importance  to patient 1) Learn and implement skills and strategies to reduce stress 2) Learn and implement skills and strategies to reduce anxiety 3) Learn and implement skills and strategies to reduce depression   Client Statement of Needs: better understanding of how to mange stress, learn to be more mindful, learn how to handle problems instead of panicking   Treatment Level: Weekly Outpatient Individual Psychotherapy  Symptoms: feeling down, fatigue, constant worry, episodes of panic,   Client Treatment Preferences: none   Healthcare consumer's goal for treatment:  Psychologist, [name], Ph.D. will support the patient's ability to achieve the goals identified. Cognitive Behavioral Therapy, Dialectical Behavioral Therapy, Motivational Interviewing, behavior activation and other evidenced-based practices will be used to promote progress towards healthy functioning.   Healthcare consumer Alcee Sipos will: Actively participate in therapy, working towards healthy functioning.    *Justification for Continuation/Discontinuation of Goal: R=Revised, O=Ongoing, A=Achieved, D=Discontinued  Goal 1) Learn and implement skills and strategies to reduce stress  5 Point Likert rating baseline date: 12/14/21 Target Date Goal Was reviewed Status Code Progress towards goal/Likert rating  12/15/22          O              Goal 2) Learn and implement skills and strategies to reduce  anxiety  5 Point Likert rating baseline date: 12/14/21 Target Date Goal Was reviewed Status Code Progress towards goal/Likert rating  12/15/22           O              Goal 3) Learn and implement skills and strategies to reduce depression  5 Point Likert rating baseline date: 12/14/21 Target Date Goal Was reviewed Status Code Progress towards goal/Likert rating  12/15/22           O              This plan has been reviewed and created by the following participants:  This plan will be reviewed at least every 12  months. Date Behavioral Health Clinician Date Guardian/Patient   12/14/21  [name], Ph.D.   12/14/21 [name]                   Diagnoses:  Major Depressive Disorder, single episode, mild Phase of Life Problem  [place] reports that he has been very sick with a sinus infection.  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  We d/e/p some financial issues he is navigating.    In session today, I f/u on a d/ we started related to his relationship history, unresolved feeling towards his ex-girlfriend w/ whom he continues to have contact and steps to move forward.   [name], PhD

## 2022-03-29 ENCOUNTER — Ambulatory Visit (INDEPENDENT_AMBULATORY_CARE_PROVIDER_SITE_OTHER): Payer: BC Managed Care – PPO | Admitting: Psychology

## 2022-03-29 DIAGNOSIS — F411 Generalized anxiety disorder: Secondary | ICD-10-CM

## 2022-03-29 NOTE — Progress Notes (Signed)
PROGRESS NOTE:   Name: [name] Date: 03/29/2022 MRN: [id number] DOB: 03-Jul-1990 PCP: Hoyt Koch, MD   Time spent: 1:00 - 1:55 PM  In session today I met with [name] in office face-to-face in person individual psychotherapy.    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  He states that his stress is creating physical/medical problems and in his relationships.  [name] broke up with his girlfriend in November and they have been talking on and off.  He is hopeful that they will get back together again but he's afraid to ask if she does.    His parents divorced two years ago.  They waited until their boys were our of school.  Older sister [name] (39) lives in [place] and has three kids who are in [place].  His sister is divorced but in a long term relationship.  Younger brother, [name] who lives in [place] and works for [employer] and is in it as a Designer, jewellery.  [name] came out as bi-sexual and his father is not on board.  [name]'s relationship with his dad got better once he went to college.  Growing up he was closer to his mom, but after the divorce he has a lot of resentment towards her.  She is constantly cutting his father down when he is with her.     Mental Status Exam: Appearance:   Fairly Groomed     Behavior:  Appropriate  Motor:  Normal  Speech/Language:   NA  Affect:  Depressed  Mood:  normal  Thought process:  normal  Thought content:    WNL  Sensory/Perceptual disturbances:    WNL  Orientation:  oriented to person, place, time/date, and situation  Attention:  NA  Concentration:  Good  Memory:  WNL  Fund of knowledge:   Good  Insight:    Good  Judgment:   Good  Impulse Control:  Good    Individualized Treatment Plan       Strengths: bright, verbal, reflective, resourceful  Supports: family, supportive friendships   Goal/Needs for Treatment:  In order of importance  to patient 1) Learn and implement skills and strategies to reduce stress 2) Learn and implement skills and strategies to reduce anxiety 3) Learn and implement skills and strategies to reduce depression   Client Statement of Needs: better understanding of how to mange stress, learn to be more mindful, learn how to handle problems instead of panicking   Treatment Level: Weekly Outpatient Individual Psychotherapy  Symptoms: feeling down, fatigue, constant worry, episodes of panic,   Client Treatment Preferences: none   Healthcare consumer's goal for treatment:  Psychologist, [name], Ph.D. will support the patient's ability to achieve the goals identified. Cognitive Behavioral Therapy, Dialectical Behavioral Therapy, Motivational Interviewing, behavior activation and other evidenced-based practices will be used to promote progress towards healthy functioning.   Healthcare consumer Nii Ellefsen will: Actively participate in therapy, working towards healthy functioning.    *Justification for Continuation/Discontinuation of Goal: R=Revised, O=Ongoing, A=Achieved, D=Discontinued  Goal 1) Learn and implement skills and strategies to reduce stress  5 Point Likert rating baseline date: 12/14/21 Target Date Goal Was reviewed Status Code Progress towards goal/Likert rating  12/15/22          O              Goal 2) Learn and implement skills and strategies to reduce  anxiety  5 Point Likert rating baseline date: 12/14/21 Target Date Goal Was reviewed Status Code Progress towards goal/Likert rating  12/15/22           O              Goal 3) Learn and implement skills and strategies to reduce depression  5 Point Likert rating baseline date: 12/14/21 Target Date Goal Was reviewed Status Code Progress towards goal/Likert rating  12/15/22           O              This plan has been reviewed and created by the following participants:  This plan will be reviewed at least every 12  months. Date Behavioral Health Clinician Date Guardian/Patient   12/14/21  [name], Ph.D.   12/14/21 [name]                   Diagnoses:  Major Depressive Disorder, single episode, mild Phase of Life Problem  [name] reports that he got an email from his manager that negatively effected his mood.  We d/p what occurred, how he felt and how he managed his emotions.  I noted that the way he is approaching the day and anticipating interactions with his manger are setting himself up for high anxiety.  I provided him with psycho education around anxiety and sympathetic nervous system activation.  We d/ alternative behaviors that would better manage his anxiety.  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   [name], PhD

## 2022-04-05 ENCOUNTER — Ambulatory Visit (INDEPENDENT_AMBULATORY_CARE_PROVIDER_SITE_OTHER): Payer: BC Managed Care – PPO | Admitting: Psychology

## 2022-04-05 DIAGNOSIS — F411 Generalized anxiety disorder: Secondary | ICD-10-CM

## 2022-04-05 NOTE — Progress Notes (Signed)
PROGRESS NOTE:   Name: Damarques Sorkin Date: 04/05/2022 MRN: [id number] DOB: December 07, 1990 PCP: Hoyt Koch, MD   Time spent: 1:00 - 1:55 PM  In session today I met with [name] in office face-to-face in person individual psychotherapy.    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  He states that his stress is creating physical/medical problems and in his relationships.  Kieren broke up with his girlfriend in November and they have been talking on and off.  He is hopeful that they will get back together again but he's afraid to ask if she does.    His parents divorced two years ago.  They waited until their boys were our of school.  Older sister [name] (39) lives in [place] and has three kids who are in [place].  His sister is divorced but in a long term relationship.  Younger brother, [name] who lives in [place] and works for [employer] and is in it as a Designer, jewellery.  [name] came out as bi-sexual and his father is not on board.  Chin's relationship with his dad got better once he went to college.  Growing up he was closer to his mom, but after the divorce he has a lot of resentment towards her.  She is constantly cutting his father down when he is with her.     Mental Status Exam: Appearance:   Fairly Groomed     Behavior:  Appropriate  Motor:  Normal  Speech/Language:   NA  Affect:  Depressed  Mood:  normal  Thought process:  normal  Thought content:    WNL  Sensory/Perceptual disturbances:    WNL  Orientation:  oriented to person, place, time/date, and situation  Attention:  NA  Concentration:  Good  Memory:  WNL  Fund of knowledge:   Good  Insight:    Good  Judgment:   Good  Impulse Control:  Good    Individualized Treatment Plan       Strengths: bright, verbal, reflective, resourceful  Supports: family, supportive friendships   Goal/Needs for Treatment:  In order of importance  to patient 1) Learn and implement skills and strategies to reduce stress 2) Learn and implement skills and strategies to reduce anxiety 3) Learn and implement skills and strategies to reduce depression   Client Statement of Needs: better understanding of how to mange stress, learn to be more mindful, learn how to handle problems instead of panicking   Treatment Level: Weekly Outpatient Individual Psychotherapy  Symptoms: feeling down, fatigue, constant worry, episodes of panic,   Client Treatment Preferences: none   Healthcare consumer's goal for treatment:  Psychologist, [name], Ph.D. will support the patient's ability to achieve the goals identified. Cognitive Behavioral Therapy, Dialectical Behavioral Therapy, Motivational Interviewing, behavior activation and other evidenced-based practices will be used to promote progress towards healthy functioning.   Healthcare consumer Khylil Degner will: Actively participate in therapy, working towards healthy functioning.    *Justification for Continuation/Discontinuation of Goal: R=Revised, O=Ongoing, A=Achieved, D=Discontinued  Goal 1) Learn and implement skills and strategies to reduce stress  5 Point Likert rating baseline date: 12/14/21 Target Date Goal Was reviewed Status Code Progress towards goal/Likert rating  12/15/22          O              Goal 2) Learn and implement skills and strategies to reduce  anxiety  5 Point Likert rating baseline date: 12/14/21 Target Date Goal Was reviewed Status Code Progress towards goal/Likert rating  12/15/22           O              Goal 3) Learn and implement skills and strategies to reduce depression  5 Point Likert rating baseline date: 12/14/21 Target Date Goal Was reviewed Status Code Progress towards goal/Likert rating  12/15/22           O              This plan has been reviewed and created by the following participants:  This plan will be reviewed at least every 12  months. Date Behavioral Health Clinician Date Guardian/Patient   12/14/21  [name], Ph.D.   12/14/21 [name]                   Diagnoses:  Major Depressive Disorder, single episode, mild Phase of Life Problem  [place] reports that he spoke with his department head as we d/ in our last session.  It states that it was a productive meeting.  We d/e/p what he was able to say, his response to his feedback regarding his manager and next steps.  We d/ what and how he wants to approach his next meeting with his manager.  I provided the support and guidance you needed.      [name], PhD

## 2022-04-12 ENCOUNTER — Ambulatory Visit: Payer: BC Managed Care – PPO | Admitting: Psychology

## 2022-04-19 ENCOUNTER — Ambulatory Visit (INDEPENDENT_AMBULATORY_CARE_PROVIDER_SITE_OTHER): Payer: BC Managed Care – PPO | Admitting: Psychology

## 2022-04-19 DIAGNOSIS — F411 Generalized anxiety disorder: Secondary | ICD-10-CM

## 2022-04-19 NOTE — Progress Notes (Signed)
PROGRESS NOTE:   Name: [name] Date: 04/19/2022 MRN: [id number] DOB: [birth year] PCP: Hoyt Koch, MD   Time spent: 1:00 - 1:55 PM  In session today I met with [name] in office face-to-face in person individual psychotherapy.    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  He states that his stress is creating physical/medical problems and in his relationships.  Ladell broke up with his girlfriend in November and they have been talking on and off.  He is hopeful that they will get back together again but he's afraid to ask if she does.    His parents divorced two years ago.  They waited until their boys were our of school.  Older sister [name] (39) lives in [place] and has three kids who are in [place].  His sister is divorced but in a long term relationship.  Younger brother, [name] who lives in [place] and works for [employer] and is in it as a Designer, jewellery.  [name] came out as bi-sexual and his father is not on board.  [name]'s relationship with his dad got better once he went to college.  Growing up he was closer to his mom, but after the divorce he has a lot of resentment towards her.  She is constantly cutting his father down when he is with her.     Mental Status Exam: Appearance:   Fairly Groomed     Behavior:  Appropriate  Motor:  Normal  Speech/Language:   NA  Affect:  Depressed  Mood:  normal  Thought process:  normal  Thought content:    WNL  Sensory/Perceptual disturbances:    WNL  Orientation:  oriented to person, place, time/date, and situation  Attention:  NA  Concentration:  Good  Memory:  WNL  Fund of knowledge:   Good  Insight:    Good  Judgment:   Good  Impulse Control:  Good    Individualized Treatment Plan       Strengths: bright, verbal, reflective, resourceful  Supports: family, supportive friendships   Goal/Needs for Treatment:  In order of importance  to patient 1) Learn and implement skills and strategies to reduce stress 2) Learn and implement skills and strategies to reduce anxiety 3) Learn and implement skills and strategies to reduce depression   Client Statement of Needs: better understanding of how to mange stress, learn to be more mindful, learn how to handle problems instead of panicking   Treatment Level: Weekly Outpatient Individual Psychotherapy  Symptoms: feeling down, fatigue, constant worry, episodes of panic,   Client Treatment Preferences: none   Healthcare consumer's goal for treatment:  Psychologist, [name], Ph.D. will support the patient's ability to achieve the goals identified. Cognitive Behavioral Therapy, Dialectical Behavioral Therapy, Motivational Interviewing, behavior activation and other evidenced-based practices will be used to promote progress towards healthy functioning.   Healthcare consumer [name] will: Actively participate in therapy, working towards healthy functioning.    *Justification for Continuation/Discontinuation of Goal: R=Revised, O=Ongoing, A=Achieved, D=Discontinued  Goal 1) Learn and implement skills and strategies to reduce stress  5 Point Likert rating baseline date: 12/14/21 Target Date Goal Was reviewed Status Code Progress towards goal/Likert rating  12/15/22          O              Goal 2) Learn and implement skills and strategies to reduce  anxiety  5 Point Likert rating baseline date: 12/14/21 Target Date Goal Was reviewed Status Code Progress towards goal/Likert rating  12/15/22           O              Goal 3) Learn and implement skills and strategies to reduce depression  5 Point Likert rating baseline date: 12/14/21 Target Date Goal Was reviewed Status Code Progress towards goal/Likert rating  12/15/22           O              This plan has been reviewed and created by the following participants:  This plan will be reviewed at least every 12  months. Date Behavioral Health Clinician Date Guardian/Patient   12/14/21  [name], Ph.D.   12/14/21 [name]                   Diagnoses:  Major Depressive Disorder, single episode, mild Phase of Life Problem  Atilano reports that he felt terrible with COVID and was finally feeling well enough to get out.  Because he came down with COVID, he was able to have the meeting with his Chartered loss adjuster.  We continued our d/ about issues he needs to d/ in their meeting.  I noted a pattern he and his manager get into.  We d/e the pattern, his contribution and possible ways to address the need for change.    [name], PhD

## 2022-04-26 ENCOUNTER — Ambulatory Visit (INDEPENDENT_AMBULATORY_CARE_PROVIDER_SITE_OTHER): Payer: BC Managed Care – PPO | Admitting: Psychology

## 2022-04-26 DIAGNOSIS — F411 Generalized anxiety disorder: Secondary | ICD-10-CM

## 2022-04-26 NOTE — Progress Notes (Signed)
PROGRESS NOTE:   Name: [name] Date: 04/26/2022 MRN: [id number] DOB: 1990-12-03 PCP: Hoyt Koch, MD   Time spent: 1:00 - 1:55 PM  In session today I met with [name] in office face-to-face in person individual psychotherapy.    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  He states that his stress is creating physical/medical problems and in his relationships.  [name] broke up with his girlfriend in November and they have been talking on and off.  He is hopeful that they will get back together again but he's afraid to ask if she does.    His parents divorced two years ago.  They waited until their boys were our of school.  Older sister [name] (39) lives in [place] and has three kids who are in [place].  His sister is divorced but in a long term relationship.  Younger brother, [name] who lives in [place] and works for [employer] and is in it as a Designer, jewellery.  [name] came out as bi-sexual and his father is not on board.  [name]'s relationship with his dad got better once he went to college.  Growing up he was closer to his mom, but after the divorce he has a lot of resentment towards her.  She is constantly cutting his father down when he is with her.     Mental Status Exam: Appearance:   Fairly Groomed     Behavior:  Appropriate  Motor:  Normal  Speech/Language:   NA  Affect:  Depressed  Mood:  normal  Thought process:  normal  Thought content:    WNL  Sensory/Perceptual disturbances:    WNL  Orientation:  oriented to person, place, time/date, and situation  Attention:  NA  Concentration:  Good  Memory:  WNL  Fund of knowledge:   Good  Insight:    Good  Judgment:   Good  Impulse Control:  Good    Individualized Treatment Plan       Strengths: bright, verbal, reflective, resourceful  Supports: family, supportive friendships   Goal/Needs for Treatment:  In order of importance  to patient 1) Learn and implement skills and strategies to reduce stress 2) Learn and implement skills and strategies to reduce anxiety 3) Learn and implement skills and strategies to reduce depression   Client Statement of Needs: better understanding of how to mange stress, learn to be more mindful, learn how to handle problems instead of panicking   Treatment Level: Weekly Outpatient Individual Psychotherapy  Symptoms: feeling down, fatigue, constant worry, episodes of panic,   Client Treatment Preferences: none   Healthcare consumer's goal for treatment:  Psychologist, [name], Ph.D. will support the patient's ability to achieve the goals identified. Cognitive Behavioral Therapy, Dialectical Behavioral Therapy, Motivational Interviewing, behavior activation and other evidenced-based practices will be used to promote progress towards healthy functioning.   Healthcare consumer [name] will: Actively participate in therapy, working towards healthy functioning.    *Justification for Continuation/Discontinuation of Goal: R=Revised, O=Ongoing, A=Achieved, D=Discontinued  Goal 1) Learn and implement skills and strategies to reduce stress  5 Point Likert rating baseline date: 12/14/21 Target Date Goal Was reviewed Status Code Progress towards goal/Likert rating  12/15/22          O              Goal 2) Learn and implement skills and strategies to reduce  anxiety  5 Point Likert rating baseline date: 12/14/21 Target Date Goal Was reviewed Status Code Progress towards goal/Likert rating  12/15/22           O              Goal 3) Learn and implement skills and strategies to reduce depression  5 Point Likert rating baseline date: 12/14/21 Target Date Goal Was reviewed Status Code Progress towards goal/Likert rating  12/15/22           O              This plan has been reviewed and created by the following participants:  This plan will be reviewed at least every 12  months. Date Behavioral Health Clinician Date Guardian/Patient   12/14/21  [name], Ph.D.   12/14/21 [name]                   Diagnoses:  Major Depressive Disorder, single episode, mild Phase of Life Problem  [name] reports that he still hasn't had his one-on-one meeting.  He is however noticing some changes that have made his work less stressful.  We d/e/p the changes he's noticed, the changes he is attempting to implement and next steps.    Lastly, [name] states that he has been having trouble sleeping this past week.  After determining that factors other than the hour change (daylight savings time) might be contributing to his difficulties, we reviewed good sleep hygiene and I made some suggestions to promote a quieter mind at bedtime.  Randy Crochet, PhD

## 2022-05-03 ENCOUNTER — Ambulatory Visit: Payer: BC Managed Care – PPO | Admitting: Psychology

## 2022-05-10 ENCOUNTER — Ambulatory Visit (INDEPENDENT_AMBULATORY_CARE_PROVIDER_SITE_OTHER): Payer: BC Managed Care – PPO | Admitting: Psychology

## 2022-05-10 DIAGNOSIS — F411 Generalized anxiety disorder: Secondary | ICD-10-CM

## 2022-05-10 NOTE — Progress Notes (Signed)
PROGRESS NOTE:   Name: Oluwadarasimi Kucera Date: 05/10/2022 MRN: [id number] DOB: Oct 03, 1990 PCP: Hoyt Koch, MD   Time spent: 1:00 - 1:55 PM  In session today I met with [name] in office face-to-face in person individual psychotherapy.    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  He states that his stress is creating physical/medical problems and in his relationships.  Ewald broke up with his girlfriend in November and they have been talking on and off.  He is hopeful that they will get back together again but he's afraid to ask if she does.    His parents divorced two years ago.  They waited until their boys were our of school.  Older sister [name] (39) lives in [place] and has three kids who are in [place].  His sister is divorced but in a long term relationship.  Younger brother, [name] who lives in [place] and works for [employer] and is in it as a Designer, jewellery.  [name] came out as bi-sexual and his father is not on board.  Brylon's relationship with his dad got better once he went to college.  Growing up he was closer to his mom, but after the divorce he has a lot of resentment towards her.  She is constantly cutting his father down when he is with her.     Mental Status Exam: Appearance:   Fairly Groomed     Behavior:  Appropriate  Motor:  Normal  Speech/Language:   NA  Affect:  Depressed  Mood:  normal  Thought process:  normal  Thought content:    WNL  Sensory/Perceptual disturbances:    WNL  Orientation:  oriented to person, place, time/date, and situation  Attention:  NA  Concentration:  Good  Memory:  WNL  Fund of knowledge:   Good  Insight:    Good  Judgment:   Good  Impulse Control:  Good    Individualized Treatment Plan       Strengths: bright, verbal, reflective, resourceful  Supports: family, supportive friendships   Goal/Needs for Treatment:  In order of importance  to patient 1) Learn and implement skills and strategies to reduce stress 2) Learn and implement skills and strategies to reduce anxiety 3) Learn and implement skills and strategies to reduce depression   Client Statement of Needs: better understanding of how to mange stress, learn to be more mindful, learn how to handle problems instead of panicking   Treatment Level: Weekly Outpatient Individual Psychotherapy  Symptoms: feeling down, fatigue, constant worry, episodes of panic,   Client Treatment Preferences: none   Healthcare consumer's goal for treatment:  Psychologist, [name], Ph.D. will support the patient's ability to achieve the goals identified. Cognitive Behavioral Therapy, Dialectical Behavioral Therapy, Motivational Interviewing, behavior activation and other evidenced-based practices will be used to promote progress towards healthy functioning.   Healthcare consumer Waldemar Schweppe will: Actively participate in therapy, working towards healthy functioning.    *Justification for Continuation/Discontinuation of Goal: R=Revised, O=Ongoing, A=Achieved, D=Discontinued  Goal 1) Learn and implement skills and strategies to reduce stress  5 Point Likert rating baseline date: 12/14/21 Target Date Goal Was reviewed Status Code Progress towards goal/Likert rating  12/15/22          O              Goal 2) Learn and implement skills and strategies to reduce  anxiety  5 Point Likert rating baseline date: 12/14/21 Target Date Goal Was reviewed Status Code Progress towards goal/Likert rating  12/15/22           O              Goal 3) Learn and implement skills and strategies to reduce depression  5 Point Likert rating baseline date: 12/14/21 Target Date Goal Was reviewed Status Code Progress towards goal/Likert rating  12/15/22           O              This plan has been reviewed and created by the following participants:  This plan will be reviewed at least every 12  months. Date Behavioral Health Clinician Date Guardian/Patient   12/14/21  [name], Ph.D.   12/14/21 [name]                   Diagnoses:  Major Depressive Disorder, single episode, mild Phase of Life Problem   Ajahni reports that work has been less stressful.  He feels like the d/ he had with his director seems to have effected the changes they d/.  There are a number of issues that persist that we will continue to work on our sessions.  I have modeled and encouraged him to establish expectations and use more assertive language.     [name], PhD

## 2022-05-17 ENCOUNTER — Ambulatory Visit (INDEPENDENT_AMBULATORY_CARE_PROVIDER_SITE_OTHER): Payer: BC Managed Care – PPO | Admitting: Psychology

## 2022-05-17 DIAGNOSIS — F411 Generalized anxiety disorder: Secondary | ICD-10-CM

## 2022-05-17 NOTE — Progress Notes (Signed)
PROGRESS NOTE:   Name: [name] Date: 05/17/2022 MRN: [id number] DOB: 01-Jul-1990 PCP: Hoyt Koch, MD   Time spent: 1:00 - 1:55 PM  In session today I met with [name] in office face-to-face in person individual psychotherapy.    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  He states that his stress is creating physical/medical problems and in his relationships.  [name] broke up with his girlfriend in November and they have been talking on and off.  He is hopeful that they will get back together again but he's afraid to ask if she does.    His parents divorced two years ago.  They waited until their boys were our of school.  Older sister [name] (39) lives in [place] and has three kids who are in [place].  His sister is divorced but in a long term relationship.  Younger brother, [name] who lives in [place] and works for [employer] and is in it as a Designer, jewellery.  [name] came out as bi-sexual and his father is not on board.  [name] relationship with his dad got better once he went to college.  Growing up he was closer to his mom, but after the divorce he has a lot of resentment towards her.  She is constantly cutting his father down when he is with her.     Mental Status Exam: Appearance:   Fairly Groomed     Behavior:  Appropriate  Motor:  Normal  Speech/Language:   NA  Affect:  Depressed  Mood:  normal  Thought process:  normal  Thought content:    WNL  Sensory/Perceptual disturbances:    WNL  Orientation:  oriented to person, place, time/date, and situation  Attention:  NA  Concentration:  Good  Memory:  WNL  Fund of knowledge:   Good  Insight:    Good  Judgment:   Good  Impulse Control:  Good    Individualized Treatment Plan       Strengths: bright, verbal, reflective, resourceful  Supports: family, supportive friendships   Goal/Needs for Treatment:  In order of importance  to patient 1) Learn and implement skills and strategies to reduce stress 2) Learn and implement skills and strategies to reduce anxiety 3) Learn and implement skills and strategies to reduce depression   Client Statement of Needs: better understanding of how to mange stress, learn to be more mindful, learn how to handle problems instead of panicking   Treatment Level: Weekly Outpatient Individual Psychotherapy  Symptoms: feeling down, fatigue, constant worry, episodes of panic,   Client Treatment Preferences: none   Healthcare consumer's goal for treatment:  Psychologist, [name], Ph.D. will support the patient's ability to achieve the goals identified. Cognitive Behavioral Therapy, Dialectical Behavioral Therapy, Motivational Interviewing, behavior activation and other evidenced-based practices will be used to promote progress towards healthy functioning.   Healthcare consumer [name] will: Actively participate in therapy, working towards healthy functioning.    *Justification for Continuation/Discontinuation of Goal: R=Revised, O=Ongoing, A=Achieved, D=Discontinued  Goal 1) Learn and implement skills and strategies to reduce stress  5 Point Likert rating baseline date: 12/14/21 Target Date Goal Was reviewed Status Code Progress towards goal/Likert rating  12/15/22          O              Goal 2) Learn and implement skills and strategies to reduce  anxiety  5 Point Likert rating baseline date: 12/14/21 Target Date Goal Was reviewed Status Code Progress towards goal/Likert rating  12/15/22           O              Goal 3) Learn and implement skills and strategies to reduce depression  5 Point Likert rating baseline date: 12/14/21 Target Date Goal Was reviewed Status Code Progress towards goal/Likert rating  12/15/22           O              This plan has been reviewed and created by the following participants:  This plan will be reviewed at least every 12  months. Date Behavioral Health Clinician Date Guardian/Patient   12/14/21  [name], Ph.D.   12/14/21 [name]                   Diagnoses:  Major Depressive Disorder, single episode, mild Phase of Life Problem   [name] reports that he had a nice visit with his brother in [place].  He made some observations about how different the attitudes are towards LGBTQ and mixed couples.  This opened a d/e/p about his brother who came out as bisexual to his parents (nearly 4 years ago) but never has come out to him.  We d/e what it would be like to have an honest conversation with his brother before he gets deployed.  I encouraged [name] to imagine how his relationship with his brother might be different if this "secret" wasn't between them?  We were able to d/ and practice some possible scripts for approaching his brother and closing the gap in their relationship.   Randy Crochet, PhD

## 2022-05-24 ENCOUNTER — Ambulatory Visit (INDEPENDENT_AMBULATORY_CARE_PROVIDER_SITE_OTHER): Payer: BC Managed Care – PPO | Admitting: Psychology

## 2022-05-24 DIAGNOSIS — F411 Generalized anxiety disorder: Secondary | ICD-10-CM | POA: Diagnosis not present

## 2022-05-24 NOTE — Progress Notes (Signed)
PROGRESS NOTE:   Name: [name] Date: 05/24/2022 MRN: [id number] DOB: [birth year] PCP: Myrlene Broker, MD   Time spent: 1:00 - 1:55 PM  In session today I met with [name] in office face-to-face in person individual psychotherapy.    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  He states that Randy stress is creating physical/medical problems and in Randy relationships.  [name] broke up with Randy girlfriend in November and they have been talking on and off.  He is hopeful that they will get back together again but he's afraid to ask if she does.    Randy parents divorced two years ago.  They waited until their boys were our of school.  Randy sister [name] (39) lives in [place] and has three kids who are in [address].  Randy sister is divorced but in a long term relationship.  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  [name] came out as bi-sexual and [name] is not on board.  [name]'s relationship with Randy dad got better once he went to college.  Growing up he was closer to [name], but after the divorce he has a lot of resentment towards her.  She is constantly cutting [name] down when he is with her.     Mental Status Exam: Appearance:   Fairly Groomed     Behavior:  Appropriate  Motor:  Normal  Speech/Language:   NA  Affect:  Depressed  Mood:  normal  Thought process:  normal  Thought content:    WNL  Sensory/Perceptual disturbances:    WNL  Orientation:  oriented to person, place, time/date, and situation  Attention:  NA  Concentration:  Good  Memory:  WNL  Fund of knowledge:   Good  Insight:    Good  Judgment:   Good  Impulse Control:  Good    Individualized Treatment Plan       Strengths: bright, verbal, reflective, resourceful  Supports: family, supportive friendships   Goal/Needs for Treatment:  In order of importance  to patient 1) Learn and implement skills and strategies to reduce stress 2) Learn and implement skills and strategies to reduce anxiety 3) Learn and implement skills and strategies to reduce depression   Client Statement of Needs: better understanding of how to mange stress, learn to be more mindful, learn how to handle problems instead of panicking   Treatment Level: Weekly Outpatient Individual Psychotherapy  Symptoms: feeling down, fatigue, constant worry, episodes of panic,   Client Treatment Preferences: none   Healthcare consumer's goal for treatment:  Psychologist, Randy Favors, Ph.D. will support the patient's ability to achieve the goals identified. Cognitive Behavioral Therapy, Dialectical Behavioral Therapy, Motivational Interviewing, behavior activation and other evidenced-based practices will be used to promote progress towards healthy functioning.   Healthcare consumer [name] will: Actively participate in therapy, working towards healthy functioning.    *Justification for Continuation/Discontinuation of Goal: R=Revised, O=Ongoing, A=Achieved, D=Discontinued  Goal 1) Learn and implement skills and strategies to reduce stress  5 Point Likert rating baseline date: 12/14/21 Target Date Goal Was reviewed Status Code Progress towards goal/Likert rating  12/15/22          O              Goal 2) Learn and implement skills and strategies to reduce anxiety  5 Point Likert rating baseline date: 12/14/21 Target Date Goal Was reviewed Status Code Progress towards goal/Likert rating  12/15/22           O              Goal 3) Learn and implement skills and strategies to reduce depression  5 Point Likert rating baseline date: 12/14/21 Target Date Goal Was reviewed Status Code Progress towards goal/Likert rating  12/15/22           O              This plan has been reviewed and created by the following participants:  This plan will be reviewed at least every 12  months. Date Behavioral Health Clinician Date Guardian/Patient   12/14/21  [name], Ph.D.   12/14/21 [name]                   Diagnoses:  Major Depressive Disorder, single episode, mild Phase of Life Problem   Beryle Beamsrevor reports that he had a stressful morning due to some scheduling mishaps.  We d/e/p other recent work stresses.  I noted Randy pattern of avoidance in both at work and in relationship.  We d/e the costs and the benefits of this particular style of dealing with conflict.  I requested that he observe and note when he resorts to this pattern, triggers and outcomes, while we are on break.  We reviewed my vacation schedule and confirmed Randy next appointment.   Randy FavorsMary Ann Darlean Warmoth, PhD

## 2022-05-31 ENCOUNTER — Ambulatory Visit: Payer: BC Managed Care – PPO | Admitting: Psychology

## 2022-06-07 ENCOUNTER — Ambulatory Visit: Payer: BC Managed Care – PPO | Admitting: Psychology

## 2022-06-14 ENCOUNTER — Ambulatory Visit: Payer: BC Managed Care – PPO | Admitting: Psychology

## 2022-06-21 ENCOUNTER — Ambulatory Visit: Payer: BC Managed Care – PPO | Admitting: Psychology

## 2022-06-28 ENCOUNTER — Ambulatory Visit: Payer: BC Managed Care – PPO | Admitting: Psychology

## 2022-07-05 ENCOUNTER — Ambulatory Visit: Payer: BC Managed Care – PPO | Admitting: Psychology

## 2022-07-12 ENCOUNTER — Ambulatory Visit: Payer: BC Managed Care – PPO | Admitting: Psychology

## 2022-07-19 ENCOUNTER — Ambulatory Visit (INDEPENDENT_AMBULATORY_CARE_PROVIDER_SITE_OTHER): Payer: BC Managed Care – PPO | Admitting: Psychology

## 2022-07-19 DIAGNOSIS — F411 Generalized anxiety disorder: Secondary | ICD-10-CM | POA: Diagnosis not present

## 2022-07-19 NOTE — Progress Notes (Signed)
PROGRESS NOTE:   Name: Abed Schar Date: 07/19/2022 MRN: [id number] DOB: 1991/02/06 PCP: Myrlene Broker, MD   Time spent: 1:00 - 1:55 PM  In session today I met with Lanney Gins in office face-to-face in person individual psychotherapy.    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  He states that his stress is creating physical/medical problems and in his relationships.  Yeng broke up with his girlfriend in November and they have been talking on and off.  He is hopeful that they will get back together again but he's afraid to ask if she does.    His parents divorced two years ago.  They waited until their boys were our of school.  Older sister Leavy Cella (39) lives in [place] and has three kids who are in [address].  His sister is divorced but in a long term relationship.  Younger brother, Luanna Salk who lives in [place] and works for [employer] and is in it as a Event organiser.  Luanna Salk came out as bi-sexual and his father is not on board.  Baudelio's relationship with his dad got better once he went to college.  Growing up he was closer to his mom, but after the divorce he has a lot of resentment towards her.  She is constantly cutting his father down when he is with her.     Mental Status Exam: Appearance:   Fairly Groomed     Behavior:  Appropriate  Motor:  Normal  Speech/Language:   NA  Affect:  Depressed  Mood:  normal  Thought process:  normal  Thought content:    WNL  Sensory/Perceptual disturbances:    WNL  Orientation:  oriented to person, place, time/date, and situation  Attention:  NA  Concentration:  Good  Memory:  WNL  Fund of knowledge:   Good  Insight:    Good  Judgment:   Good  Impulse Control:  Good    Individualized Treatment Plan       Strengths: bright, verbal, reflective, resourceful  Supports: family, supportive friendships   Goal/Needs for Treatment:  In order of importance to  patient 1) Learn and implement skills and strategies to reduce stress 2) Learn and implement skills and strategies to reduce anxiety 3) Learn and implement skills and strategies to reduce depression   Client Statement of Needs: better understanding of how to mange stress, learn to be more mindful, learn how to handle problems instead of panicking   Treatment Level: Weekly Outpatient Individual Psychotherapy  Symptoms: feeling down, fatigue, constant worry, episodes of panic,   Client Treatment Preferences: none   Healthcare consumer's goal for treatment:  Psychologist, Hilma Favors, Ph.D. will support the patient's ability to achieve the goals identified. Cognitive Behavioral Therapy, Dialectical Behavioral Therapy, Motivational Interviewing, behavior activation and other evidenced-based practices will be used to promote progress towards healthy functioning.   Healthcare consumer [place] will: Actively participate in therapy, working towards healthy functioning.    *Justification for Continuation/Discontinuation of Goal: R=Revised, O=Ongoing, A=Achieved, D=Discontinued  Goal 1) Learn and implement skills and strategies to reduce stress  5 Point Likert rating baseline date: 12/14/21 Target Date Goal Was reviewed Status Code Progress towards goal/Likert rating  12/15/22          O              Goal 2) Learn and implement skills and strategies to reduce anxiety  5 Point Likert rating baseline date: 12/14/21 Target Date Goal Was reviewed Status Code Progress towards goal/Likert rating  12/15/22           O              Goal 3) Learn and implement skills and strategies to reduce depression  5 Point Likert rating baseline date: 12/14/21 Target Date Goal Was reviewed Status Code Progress towards goal/Likert rating  12/15/22           O              This plan has been reviewed and created by the following participants:  This plan will be reviewed at least every 12  months. Date Behavioral Health Clinician Date Guardian/Patient   12/14/21  Hilma Favors, Ph.D.   12/14/21 Lanney Gins                   Diagnoses:  Major Depressive Disorder, single episode, mild Phase of Life Problem   Hovannes reports that work has gotten busy but less stressful.  He noted that he was feeling fatigued and missing meals.  We d/ ways to manage his time more effectively.  He states that he feels like there isn't enough time in the day to do all he needs to do.  I challenged him gently, reviewed areas where he is less likely to set limits, and a encouraged him to more consciously stop "giving away" time.  I also suggested he rethink whether he is still invested in some of his sports activities and to focus only on those that truly bring him enjoyment.   Hilma Favors, PhD

## 2022-07-26 ENCOUNTER — Ambulatory Visit: Payer: BC Managed Care – PPO | Admitting: Psychology

## 2022-08-02 ENCOUNTER — Ambulatory Visit (INDEPENDENT_AMBULATORY_CARE_PROVIDER_SITE_OTHER): Payer: BC Managed Care – PPO | Admitting: Psychology

## 2022-08-02 DIAGNOSIS — F411 Generalized anxiety disorder: Secondary | ICD-10-CM | POA: Diagnosis not present

## 2022-08-02 NOTE — Progress Notes (Signed)
PROGRESS NOTE:   Name: Suren Pickert Date: 08/02/2022 MRN: [id number] DOB: 1990/03/23 PCP: Myrlene Broker, MD   Time spent: 1:15 - 2:00 PM  In session today I met with Lanney Gins in office face-to-face in person individual psychotherapy.    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  He states that his stress is creating physical/medical problems and in his relationships.  Rayborn broke up with his girlfriend in November and they have been talking on and off.  He is hopeful that they will get back together again but he's afraid to ask if she does.    His parents divorced two years ago.  They waited until their boys were our of school.  Older sister Leavy Cella (39) lives in [place] and has three kids who are in [address].  His sister is divorced but in a long term relationship.  Younger brother, Luanna Salk who lives in [place] and works for [employer] and is in it as a Event organiser.  Luanna Salk came out as bi-sexual and his father is not on board.  Katrina's relationship with his dad got better once he went to college.  Growing up he was closer to his mom, but after the divorce he has a lot of resentment towards her.  She is constantly cutting his father down when he is with her.     Mental Status Exam: Appearance:   Fairly Groomed     Behavior:  Appropriate  Motor:  Normal  Speech/Language:   NA  Affect:  Depressed  Mood:  normal  Thought process:  normal  Thought content:    WNL  Sensory/Perceptual disturbances:    WNL  Orientation:  oriented to person, place, time/date, and situation  Attention:  NA  Concentration:  Good  Memory:  WNL  Fund of knowledge:   Good  Insight:    Good  Judgment:   Good  Impulse Control:  Good    Individualized Treatment Plan       Strengths: bright, verbal, reflective, resourceful  Supports: family, supportive friendships   Goal/Needs for Treatment:  In order of importance  to patient 1) Learn and implement skills and strategies to reduce stress 2) Learn and implement skills and strategies to reduce anxiety 3) Learn and implement skills and strategies to reduce depression   Client Statement of Needs: better understanding of how to mange stress, learn to be more mindful, learn how to handle problems instead of panicking   Treatment Level: Weekly Outpatient Individual Psychotherapy  Symptoms: feeling down, fatigue, constant worry, episodes of panic,   Client Treatment Preferences: none   Healthcare consumer's goal for treatment:  Psychologist, Hilma Favors, Ph.D. will support the patient's ability to achieve the goals identified. Cognitive Behavioral Therapy, Dialectical Behavioral Therapy, Motivational Interviewing, behavior activation and other evidenced-based practices will be used to promote progress towards healthy functioning.   Healthcare consumer Hartley Hisle will: Actively participate in therapy, working towards healthy functioning.    *Justification for Continuation/Discontinuation of Goal: R=Revised, O=Ongoing, A=Achieved, D=Discontinued  Goal 1) Learn and implement skills and strategies to reduce stress  5 Point Likert rating baseline date: 12/14/21 Target Date Goal Was reviewed Status Code Progress towards goal/Likert rating  12/15/22          O              Goal 2) Learn and implement skills and strategies to reduce anxiety  5 Point Likert rating baseline date: 12/14/21 Target Date Goal Was reviewed Status Code Progress towards goal/Likert rating  12/15/22           O              Goal 3) Learn and implement skills and strategies to reduce depression  5 Point Likert rating baseline date: 12/14/21 Target Date Goal Was reviewed Status Code Progress towards goal/Likert rating  12/15/22           O              This plan has been reviewed and created by the following participants:  This plan will be reviewed at least every 12  months. Date Behavioral Health Clinician Date Guardian/Patient   12/14/21  Hilma Favors, Ph.D.   12/14/21 Lanney Gins                   Diagnoses:  Major Depressive Disorder, single episode, mild Phase of Life Problem   Willaim reports that he has a client that is "driving him crazy."  We d/e/p what is happening, how he has responded and the need to set better boundaries.  We d/e/p what makes setting boundaries so difficult and made connections between the past and present.   Lastly, we d/ particular situations and I modeled various ways to set boundaries respectfully.   Hilma Favors, PhD

## 2022-08-09 ENCOUNTER — Ambulatory Visit: Payer: BC Managed Care – PPO | Admitting: Psychology

## 2022-08-23 ENCOUNTER — Ambulatory Visit: Payer: BC Managed Care – PPO | Admitting: Psychology

## 2022-08-30 ENCOUNTER — Ambulatory Visit (INDEPENDENT_AMBULATORY_CARE_PROVIDER_SITE_OTHER): Payer: BC Managed Care – PPO | Admitting: Psychology

## 2022-08-30 DIAGNOSIS — F411 Generalized anxiety disorder: Secondary | ICD-10-CM

## 2022-08-30 NOTE — Progress Notes (Signed)
PROGRESS NOTE:   Name: [name] Date: 08/30/2022 MRN: [id number] DOB: 07/06/1990 PCP: Myrlene Broker, MD   Time spent: 11:03 -12:00 AM  In session today I met with [name] for in office face-to-face in person individual psychotherapy.    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  He states that his stress is creating physical/medical problems and in his relationships.  [name] broke up with his girlfriend in November and they have been talking on and off.  He is hopeful that they will get back together again but he's afraid to ask if she does.    His parents divorced two years ago.  They waited until their boys were our of school.  Older sister [name] (39) lives in [place] and has three kids who are in [address].  His sister is divorced but in a long term relationship.  Younger brother, [name] who lives in [place] and works for [employer] and is in it as a Event organiser.  [name] came out as bi-sexual and his father is not on board.  [name]'s relationship with his dad got better once he went to college.  Growing up he was closer to his mom, but after the divorce he has a lot of resentment towards her.  She is constantly cutting his father down when he is with her.     Mental Status Exam: Appearance:   Fairly Groomed     Behavior:  Appropriate  Motor:  Normal  Speech/Language:   NA  Affect:  Depressed  Mood:  normal  Thought process:  normal  Thought content:    WNL  Sensory/Perceptual disturbances:    WNL  Orientation:  oriented to person, place, time/date, and situation  Attention:  NA  Concentration:  Good  Memory:  WNL  Fund of knowledge:   Good  Insight:    Good  Judgment:   Good  Impulse Control:  Good    Individualized Treatment Plan       Strengths: bright, verbal, reflective, resourceful  Supports: family, supportive friendships   Goal/Needs for Treatment:  In order of  importance to patient 1) Learn and implement skills and strategies to reduce stress 2) Learn and implement skills and strategies to reduce anxiety 3) Learn and implement skills and strategies to reduce depression   Client Statement of Needs: better understanding of how to mange stress, learn to be more mindful, learn how to handle problems instead of panicking   Treatment Level: Weekly Outpatient Individual Psychotherapy  Symptoms: feeling down, fatigue, constant worry, episodes of panic,   Client Treatment Preferences: none   Healthcare consumer's goal for treatment:  Psychologist, Hilma Favors, Ph.D. will support the patient's ability to achieve the goals identified. Cognitive Behavioral Therapy, Dialectical Behavioral Therapy, Motivational Interviewing, behavior activation and other evidenced-based practices will be used to promote progress towards healthy functioning.   Healthcare consumer [name] will: Actively participate in therapy, working towards healthy functioning.    *Justification for Continuation/Discontinuation of Goal: R=Revised, O=Ongoing, A=Achieved, D=Discontinued  Goal 1) Learn and implement skills and strategies to reduce stress  5 Point Likert rating baseline date: 12/14/21 Target Date Goal Was reviewed Status Code Progress towards goal/Likert rating  12/15/22          O              Goal 2) Learn and implement skills and strategies to reduce anxiety  5 Point Likert rating baseline date: 12/14/21 Target Date Goal Was reviewed Status Code Progress towards goal/Likert rating  12/15/22           O              Goal 3) Learn and implement skills and strategies to reduce depression  5 Point Likert rating baseline date: 12/14/21 Target Date Goal Was reviewed Status Code Progress towards goal/Likert rating  12/15/22           O              This plan has been reviewed and created by the following participants:  This plan will be reviewed at least  every 12 months. Date Behavioral Health Clinician Date Guardian/Patient   12/14/21  Hilma Favors, Ph.D.   12/14/21 [name]                   Diagnoses:  Major Depressive Disorder, single episode, mild Phase of Life Problem   [name] reports that life isn't too bad.  He shared some work stresses and how he is planning to cope.  He stated that he had a good visit with his mother.  We d/p the ways his relationship with his mother is evolving.  [name] states that he's met a new woman and they have clicked.  She lives in [place], and will be leaving soon.  We d/e/p what it's been like to meet someone new that he clicks with, his anxieties that "things never work out'" and how to challenge this anxious thoughts.  I provided the support and guidance needed to continue to move forward and not succumb to his anxieties.    Hilma Favors, PhD

## 2022-09-06 ENCOUNTER — Ambulatory Visit: Payer: BC Managed Care – PPO | Admitting: Psychology

## 2022-09-13 ENCOUNTER — Ambulatory Visit: Payer: BC Managed Care – PPO | Admitting: Psychology

## 2022-09-20 ENCOUNTER — Ambulatory Visit: Payer: BC Managed Care – PPO | Admitting: Psychology

## 2022-09-27 ENCOUNTER — Ambulatory Visit: Payer: BC Managed Care – PPO | Admitting: Psychology

## 2022-09-27 DIAGNOSIS — F411 Generalized anxiety disorder: Secondary | ICD-10-CM | POA: Diagnosis not present

## 2022-09-27 NOTE — Progress Notes (Addendum)
PROGRESS NOTE:   Name: [name] Date: 09/27/2022 MRN: [id number] DOB: Jul 09, 1990 PCP: Myrlene Broker, MD   Time spent: 9:02 AM - 10:00 AM  In session today I met with [name] for in office face-to-face in person individual psychotherapy.    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  He states that his stress is creating physical/medical problems and in his relationships.  [name] broke up with his girlfriend in November and they have been talking on and off.  He is hopeful that they will get back together again but he's afraid to ask if she does.    His parents divorced two years ago.  They waited until their boys were our of school.  Older sister [name] (39) lives in [place] and has three kids who are in [address].  His sister is divorced but in a long term relationship.  Younger brother, [name] who lives in [place] and works for [employer] and is in it as a Event organiser.  [name] came out as bi-sexual and his father is not on board.  [name]'s relationship with his dad got better once he went to college.  Growing up he was closer to his mom, but after the divorce he has a lot of resentment towards her.  She is constantly cutting his father down when he is with her.     Mental Status Exam: Appearance:   Fairly Groomed     Behavior:  Appropriate  Motor:  Normal  Speech/Language:   NA  Affect:  Depressed  Mood:  normal  Thought process:  normal  Thought content:    WNL  Sensory/Perceptual disturbances:    WNL  Orientation:  oriented to person, place, time/date, and situation  Attention:  NA  Concentration:  Good  Memory:  WNL  Fund of knowledge:   Good  Insight:    Good  Judgment:   Good  Impulse Control:  Good    Individualized Treatment Plan       Strengths: bright, verbal, reflective, resourceful  Supports: family, supportive friendships   Goal/Needs for Treatment:  In order of  importance to patient 1) Learn and implement skills and strategies to reduce stress 2) Learn and implement skills and strategies to reduce anxiety 3) Learn and implement skills and strategies to reduce depression   Client Statement of Needs: better understanding of how to mange stress, learn to be more mindful, learn how to handle problems instead of panicking   Treatment Level: Weekly Outpatient Individual Psychotherapy  Symptoms: feeling down, fatigue, constant worry, episodes of panic,   Client Treatment Preferences: none   Healthcare consumer's goal for treatment:  Psychologist, Hilma Favors, Ph.D. will support the patient's ability to achieve the goals identified. Cognitive Behavioral Therapy, Dialectical Behavioral Therapy, Motivational Interviewing, behavior activation and other evidenced-based practices will be used to promote progress towards healthy functioning.   Healthcare consumer [name] will: Actively participate in therapy, working towards healthy functioning.    *Justification for Continuation/Discontinuation of Goal: R=Revised, O=Ongoing, A=Achieved, D=Discontinued  Goal 1) Learn and implement skills and strategies to reduce stress  5 Point Likert rating baseline date: 12/14/21 Target Date Goal Was reviewed Status Code Progress towards goal/Likert rating  12/15/22          O              Goal 2) Learn and implement skills and strategies to  reduce anxiety  5 Point Likert rating baseline date: 12/14/21 Target Date Goal Was reviewed Status Code Progress towards goal/Likert rating  12/15/22           O              Goal 3) Learn and implement skills and strategies to reduce depression  5 Point Likert rating baseline date: 12/14/21 Target Date Goal Was reviewed Status Code Progress towards goal/Likert rating  12/15/22           O              This plan has been reviewed and created by the following participants:  This plan will be reviewed at least  every 12 months. Date Behavioral Health Clinician Date Guardian/Patient   12/14/21  Hilma Favors, Ph.D.   12/14/21 [name]                   Diagnoses:  Major Depressive Disorder, single episode, mild Phase of Life Problem   [name] reports that he is feeling happy outside of work.  We d/e/p how his new relationship is going, the challenges he has encountered, managing his anxious thoughts, and how he continues to use what he's learned to cope and move forward in this relationship.     Hilma Favors, PhD

## 2022-09-27 NOTE — Addendum Note (Signed)
Addended by: Marin Olp ANN on: 09/27/2022 03:05 PM   Modules accepted: Level of Service

## 2022-10-04 ENCOUNTER — Ambulatory Visit: Payer: BC Managed Care – PPO | Admitting: Psychology

## 2022-10-11 ENCOUNTER — Ambulatory Visit: Payer: BC Managed Care – PPO | Admitting: Psychology

## 2022-10-18 ENCOUNTER — Ambulatory Visit: Payer: BC Managed Care – PPO | Admitting: Psychology

## 2022-11-01 ENCOUNTER — Ambulatory Visit: Payer: BC Managed Care – PPO | Admitting: Psychology

## 2022-11-07 ENCOUNTER — Ambulatory Visit (INDEPENDENT_AMBULATORY_CARE_PROVIDER_SITE_OTHER): Payer: BC Managed Care – PPO | Admitting: Psychology

## 2022-11-07 DIAGNOSIS — F411 Generalized anxiety disorder: Secondary | ICD-10-CM

## 2022-11-07 NOTE — Progress Notes (Signed)
PROGRESS NOTE:   Name: [name] Date: 11/07/2022 MRN: [id number] DOB: Jun 07, 1990 PCP: Myrlene Broker, MD   Time spent: 1:02 PM - 1:58 PM  In session today I met with [name] for in office face-to-face in person individual psychotherapy.    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  He states that his stress is creating physical/medical problems and in his relationships.  Kwadwo broke up with his girlfriend in November and they have been talking on and off.  He is hopeful that they will get back together again but he's afraid to ask if she does.    His parents divorced two years ago.  They waited until their boys were our of school.  Older sister [name] (39) lives in [place] and has three kids who are in [address].  His sister is divorced but in a long term relationship.  Younger brother, [name] who lives in [place] and works for [employer] and is in it as a Event organiser.  [name] came out as bi-sexual and his father is not on board.  [name]'s relationship with his dad got better once he went to college.  Growing up he was closer to his mom, but after the divorce he has a lot of resentment towards her.  She is constantly cutting his father down when he is with her.     Mental Status Exam: Appearance:   Fairly Groomed     Behavior:  Appropriate  Motor:  Normal  Speech/Language:   NA  Affect:  Depressed  Mood:  normal  Thought process:  normal  Thought content:    WNL  Sensory/Perceptual disturbances:    WNL  Orientation:  oriented to person, place, time/date, and situation  Attention:  NA  Concentration:  Good  Memory:  WNL  Fund of knowledge:   Good  Insight:    Good  Judgment:   Good  Impulse Control:  Good    Individualized Treatment Plan       Strengths: bright, verbal, reflective, resourceful  Supports: family, supportive friendships   Goal/Needs for Treatment:  In order of  importance to patient 1) Learn and implement skills and strategies to reduce stress 2) Learn and implement skills and strategies to reduce anxiety 3) Learn and implement skills and strategies to reduce depression   Client Statement of Needs: better understanding of how to mange stress, learn to be more mindful, learn how to handle problems instead of panicking   Treatment Level: Weekly Outpatient Individual Psychotherapy  Symptoms: feeling down, fatigue, constant worry, episodes of panic,   Client Treatment Preferences: none   Healthcare consumer's goal for treatment:  Psychologist, [name], Ph.D. will support the patient's ability to achieve the goals identified. Cognitive Behavioral Therapy, Dialectical Behavioral Therapy, Motivational Interviewing, behavior activation and other evidenced-based practices will be used to promote progress towards healthy functioning.   Healthcare consumer [name] will: Actively participate in therapy, working towards healthy functioning.    *Justification for Continuation/Discontinuation of Goal: R=Revised, O=Ongoing, A=Achieved, D=Discontinued  Goal 1) Learn and implement skills and strategies to reduce stress  5 Point Likert rating baseline date: 12/14/21 Target Date Goal Was reviewed Status Code Progress towards goal/Likert rating  12/15/22          O              Goal 2) Learn and implement skills and strategies to  reduce anxiety  5 Point Likert rating baseline date: 12/14/21 Target Date Goal Was reviewed Status Code Progress towards goal/Likert rating  12/15/22           O              Goal 3) Learn and implement skills and strategies to reduce depression  5 Point Likert rating baseline date: 12/14/21 Target Date Goal Was reviewed Status Code Progress towards goal/Likert rating  12/15/22           O              This plan has been reviewed and created by the following participants:  This plan will be reviewed at least  every 12 months. Date Behavioral Health Clinician Date Guardian/Patient   12/14/21  [name], Ph.D.   12/14/21 [name]                   Diagnoses:  Major Depressive Disorder, single episode, mild Phase of Life Problem   [name] reports that his trip to [place] to see his girlfriend [name] went well.  He states she is becoming very important to him and wants to do everything he can do to make the relationship work.  One area he is already seeing can be a challenge is his ADHD.  We d/e/p problems he has with maintain attention during conversations, forgetting information shared and inadvertently her his girlfriend's feelings.  I provided some psycho education around skills and strategies for managing common ADHD related behaviors commonly problematic in relationships.    Randy Favors, PhD

## 2022-11-29 ENCOUNTER — Ambulatory Visit: Payer: BC Managed Care – PPO | Admitting: Psychology

## 2022-11-29 DIAGNOSIS — F988 Other specified behavioral and emotional disorders with onset usually occurring in childhood and adolescence: Secondary | ICD-10-CM | POA: Diagnosis not present

## 2022-11-29 DIAGNOSIS — F320 Major depressive disorder, single episode, mild: Secondary | ICD-10-CM

## 2022-11-29 DIAGNOSIS — F411 Generalized anxiety disorder: Secondary | ICD-10-CM

## 2022-11-29 DIAGNOSIS — Z600 Problems of adjustment to life-cycle transitions: Secondary | ICD-10-CM

## 2022-11-29 NOTE — Progress Notes (Signed)
PROGRESS NOTE:  Annual Review   Name: [name] Date: 11/29/2022 MRN: [id number] DOB: 1990-12-04 PCP: Myrlene Broker, MD   Time spent: 10:02 AM - 10:58 AM  In session today I met with Lanney Gins for in office face-to-face in person individual psychotherapy.    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  He states that his stress was creating physical/medical problems and in his relationships.  Arthor broke up with his girlfriend in last November and they have been talking on and off.  He is hopeful that they will get back together again but he's afraid to ask if she does.    His parents divorced three years ago.  They waited until their boys were out of school.  Older sister Leavy Cella (62) lives in [place] and has three kids who are in [address].  His sister is divorced but in a long term relationship.  Younger brother, Luanna Salk lives in [place] and works for [employer] and is in it as a Event organiser.  Luanna Salk came out as bi-sexual and his father is not on board.  Jamario's relationship with his dad got better once he went to college.  Growing up he was closer to his mom, but after the divorce he has a lot of resentment towards her.  She is constantly cutting his father down when he is with her.     Mental Status Exam: Appearance:   Fairly Groomed     Behavior:  Appropriate  Motor:  Normal  Speech/Language:   NA  Affect:  Depressed  Mood:  normal  Thought process:  normal  Thought content:    WNL  Sensory/Perceptual disturbances:    WNL  Orientation:  oriented to person, place, time/date, and situation  Attention:  NA  Concentration:  Good  Memory:  WNL  Fund of knowledge:   Good  Insight:    Good  Judgment:   Good  Impulse Control:  Good    Risk Assessment: Danger to Self:  No Self-injurious Behavior: No Danger to Others: No Duty to Warn:no Physical Aggression / Violence:No  Access to Firearms a  concern: No  Gang Involvement:No    Substance Abuse History: Current substance abuse: No , PaGF - alcoholic     Past Psychiatric History:   No previous psychological problems have been observed Outpatient Providers:  History of Psych Hospitalization: No  Psychological Testing:  n/a     Abuse History:  Victim of: No.,  n/a    Report needed: No. Victim of Neglect:No. Perpetrator of  /a   Witness / Exposure to Domestic Violence:  n/a   Management consultant Involvement:  n/a Witness to [employer] Violence:   n/a   Family History:       Family History  Problem Relation Age of Onset   Hypertension Father     Pancreatic cancer Maternal Grandfather     Diabetes Paternal Grandmother     Stomach cancer Neg Hx     Esophageal cancer Neg Hx     Colon cancer Neg Hx            Living situation: the patient lives alone   Sexual Orientation: Straight   Relationship Status: single  Name of spouse / other: n/a If a parent, number of children / ages:    Financial Stress:  No    Income/Employment/Disability: Employment Works in Ambulance person: No  Educational History: Education: some college   Religion/Sprituality/World View: N/a   Any cultural differences that may affect / interfere with treatment:  not applicable    Recreation/Hobbies: working out,    Stressors: Health problems   Other: work   Individualized Treatment Plan         Strengths: bright, verbal, Recruitment consultant, resourceful  Supports: family, supportive friendships   Goal/Needs for Treatment:  In order of importance to patient 1) Learn and implement skills and strategies to reduce stress 2) Learn and implement skills and strategies to reduce anxiety 3) Learn and implement skills and strategies to reduce depression 4) Learn and implement skills and strategies to better manage ADHD   Client Statement of Needs: better understanding of how to mange stress, learn to be more mindful,  learn how to handle problems instead of panicking   Treatment Level: Weekly Outpatient Individual Psychotherapy  Symptoms: feeling down, fatigue, constant worry, episodes of panic,   Client Treatment Preferences: none   Healthcare consumer's goal for treatment:  Psychologist, Hilma Favors, Ph.D. will support the patient's ability to achieve the goals identified. Cognitive Behavioral Therapy, Dialectical Behavioral Therapy, Motivational Interviewing, behavior activation and other evidenced-based practices will be used to promote progress towards healthy functioning.   Healthcare consumer Bren Steers will: Actively participate in therapy, working towards healthy functioning.    *Justification for Continuation/Discontinuation of Goal: R=Revised, O=Ongoing, A=Achieved, D=Discontinued  Goal 1) Learn and implement skills and strategies to reduce stress  5 Point Likert rating baseline date: 12/14/21 Target Date Goal Was reviewed Status Code Progress towards goal/Likert rating  12/15/22 12/30/2022         O 3/5 - pt has learn skills but is inconsistent in applying them to all situations             Goal 2) Learn and implement skills and strategies to reduce anxiety  5 Point Likert rating baseline date: 12/14/21 Target Date Goal Was reviewed Status Code Progress towards goal/Likert rating  12/15/22 12/30/2022          O 3/5 - pt has learn skills but is inconsistent in applying them to all situations             Goal 3) Learn and implement skills and strategies to reduce depression  5 Point Likert rating baseline date: 12/14/21 Target Date Goal Was reviewed Status Code Progress towards goal/Likert rating  12/15/22 12/30/2022          O 3/5 - pt has learn skills but is inconsistent in applying them to all situations             Goal 4) Learn and implement skills and strategies to better manage ADHD  5 Point Likert rating baseline date: 12/14/21 Target Date Goal Was reviewed Status  Code Progress towards goal/Likert rating   12/30/2023            N                This plan has been reviewed and created by the following participants:  This plan will be reviewed at least every 12 months. Date Behavioral Health Clinician Date Guardian/Patient   12/14/21  Hilma Favors, Ph.D.   12/14/21 Lanney Gins  12/30/2022  Hilma Favors, Ph.D.  12/30/2022 Lanney Gins              Diagnoses:  Major Depressive Disorder, single episode, mild Phase of Life Problem Attention Deficit Disorder,    In session today, conducted  Denario's annual review.  We reviewed Maddix's progress, d/ goals and updated his treatment plan.  Glendon actively participated in the creation of his treatment plan and freely gave his consent.  Hilma Favors, PhD

## 2023-01-03 ENCOUNTER — Ambulatory Visit: Payer: BC Managed Care – PPO | Admitting: Psychology

## 2023-01-31 ENCOUNTER — Ambulatory Visit (INDEPENDENT_AMBULATORY_CARE_PROVIDER_SITE_OTHER): Payer: BC Managed Care – PPO | Admitting: Psychology

## 2023-01-31 DIAGNOSIS — F320 Major depressive disorder, single episode, mild: Secondary | ICD-10-CM | POA: Diagnosis not present

## 2023-01-31 DIAGNOSIS — Z600 Problems of adjustment to life-cycle transitions: Secondary | ICD-10-CM

## 2023-01-31 DIAGNOSIS — F988 Other specified behavioral and emotional disorders with onset usually occurring in childhood and adolescence: Secondary | ICD-10-CM | POA: Diagnosis not present

## 2023-01-31 DIAGNOSIS — F411 Generalized anxiety disorder: Secondary | ICD-10-CM

## 2023-01-31 NOTE — Progress Notes (Signed)
PROGRESS NOTE:    Name: [name] Date: 01/31/2023 MRN: [id number] DOB: 02/08/1991 PCP: Myrlene Broker, MD   Time spent: 10:02 AM - 10:58 AM  In session today I met with [name] for in office face-to-face in person individual psychotherapy.    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  He states that his stress was creating physical/medical problems and in his relationships.  Tyrian broke up with his girlfriend in last November and they have been talking on and off.  He is hopeful that they will get back together again but he's afraid to ask if she does.    His parents divorced three years ago.  They waited until their boys were out of school.  Older sister [name] (73) lives in [place] and has three kids who are in [address].  His sister is divorced but in a long term relationship.  Younger brother, [name] lives in [place] and works for [employer] and is in it as a Event organiser.  [name] came out as bi-sexual and his father is not on board.  [name]'s relationship with his dad got better once he went to college.  Growing up he was closer to his mom, but after the divorce he has a lot of resentment towards her.  She is constantly cutting his father down when he is with her.     Mental Status Exam: Appearance:   Fairly Groomed     Behavior:  Appropriate  Motor:  Normal  Speech/Language:   NA  Affect:  Depressed  Mood:  normal  Thought process:  normal  Thought content:    WNL  Sensory/Perceptual disturbances:    WNL  Orientation:  oriented to person, place, time/date, and situation  Attention:  NA  Concentration:  Good  Memory:  WNL  Fund of knowledge:   Good  Insight:    Good  Judgment:   Good  Impulse Control:  Good    Risk Assessment: Danger to Self:  No Self-injurious Behavior: No Danger to Others: No Duty to Warn:no Physical Aggression / Violence:No  Access to Firearms a concern: No  Gang  Involvement:No    Substance Abuse History: Current substance abuse: No , PaGF - alcoholic     Past Psychiatric History:   No previous psychological problems have been observed Outpatient Providers:  History of Psych Hospitalization: No  Psychological Testing:  n/a     Abuse History:  Victim of: No.,  n/a    Report needed: No. Victim of Neglect:No. Perpetrator of  /a   Witness / Exposure to Domestic Violence:  n/a   Management consultant Involvement:  n/a Witness to [employer] Violence:   n/a   Family History:       Family History  Problem Relation Age of Onset   Hypertension Father     Pancreatic cancer Maternal Grandfather     Diabetes Paternal Grandmother     Stomach cancer Neg Hx     Esophageal cancer Neg Hx     Colon cancer Neg Hx            Living situation: the patient lives alone   Sexual Orientation: Straight   Relationship Status: single  Name of spouse / other: n/a If a parent, number of children / ages:    Financial Stress:  No    Income/Employment/Disability: Employment Works in Ambulance person: No  Educational History: Education: some college   Religion/Sprituality/World View: N/a   Any cultural differences that may affect / interfere with treatment:  not applicable    Recreation/Hobbies: working out,    Stressors: Health problems   Other: work   Individualized Treatment Plan         Strengths: bright, verbal, Recruitment consultant, resourceful  Supports: family, supportive friendships   Goal/Needs for Treatment:  In order of importance to patient 1) Learn and implement skills and strategies to reduce stress 2) Learn and implement skills and strategies to reduce anxiety 3) Learn and implement skills and strategies to reduce depression 4) Learn and implement skills and strategies to better manage ADHD   Client Statement of Needs: better understanding of how to mange stress, learn to be more mindful, learn how to  handle problems instead of panicking   Treatment Level: Weekly Outpatient Individual Psychotherapy  Symptoms: feeling down, fatigue, constant worry, episodes of panic,   Client Treatment Preferences: none   Healthcare consumer's goal for treatment:  Psychologist, Hilma Favors, Ph.D. will support the patient's ability to achieve the goals identified. Cognitive Behavioral Therapy, Dialectical Behavioral Therapy, Motivational Interviewing, behavior activation and other evidenced-based practices will be used to promote progress towards healthy functioning.   Healthcare consumer Chance Dipace will: Actively participate in therapy, working towards healthy functioning.    *Justification for Continuation/Discontinuation of Goal: R=Revised, O=Ongoing, A=Achieved, D=Discontinued  Goal 1) Learn and implement skills and strategies to reduce stress  5 Point Likert rating baseline date: 12/14/21 Target Date Goal Was reviewed Status Code Progress towards goal/Likert rating  12/15/22 12/30/2022         O 3/5 - pt has learn skills but is inconsistent in applying them to all situations  12/30/2023          O         Goal 2) Learn and implement skills and strategies to reduce anxiety  5 Point Likert rating baseline date: 12/14/21 Target Date Goal Was reviewed Status Code Progress towards goal/Likert rating  12/15/22 12/30/2022          O 3/5 - pt has learn skills but is inconsistent in applying them to all situations  12/30/2023           O         Goal 3) Learn and implement skills and strategies to reduce depression  5 Point Likert rating baseline date: 12/14/21 Target Date Goal Was reviewed Status Code Progress towards goal/Likert rating  12/15/22 12/30/2022          O 3/5 - pt has learn skills but is inconsistent in applying them to all situations  12/30/2023           O         Goal 4) Learn and implement skills and strategies to better manage ADHD  5 Point Likert rating baseline date:  12/14/21 Target Date Goal Was reviewed Status Code Progress towards goal/Likert rating   12/30/2023            N   12/30/2023           O           This plan has been reviewed and created by the following participants:  This plan will be reviewed at least every 12 months. Date Behavioral Health Clinician Date Guardian/Patient   12/14/2021  Hilma Favors, Ph.D.   12/14/2021 [name]  12/30/2022  Hilma Favors, Ph.D.  12/30/2022 [name]  Diagnoses:  Major Depressive Disorder, single episode, mild Phase of Life Problem Attention Deficit Disorder,    Calel reports that he met Maggie's family over the Thanksgiving holiday and things went very well.  Maggie accompanied him to his cousin's wedding in [place].  We d/p how it's been for them to meet each others parents, and difficulties with his sister.  We d/ some difficult family hx with his sister and made connections between the past and present.   Hilma Favors, PhD

## 2023-02-28 ENCOUNTER — Ambulatory Visit: Payer: BC Managed Care – PPO | Admitting: Psychology

## 2023-02-28 DIAGNOSIS — Z7389 Other problems related to life management difficulty: Secondary | ICD-10-CM | POA: Diagnosis not present

## 2023-02-28 DIAGNOSIS — F909 Attention-deficit hyperactivity disorder, unspecified type: Secondary | ICD-10-CM

## 2023-02-28 DIAGNOSIS — F329 Major depressive disorder, single episode, unspecified: Secondary | ICD-10-CM | POA: Diagnosis not present

## 2023-02-28 DIAGNOSIS — F411 Generalized anxiety disorder: Secondary | ICD-10-CM

## 2023-02-28 NOTE — Progress Notes (Signed)
PROGRESS NOTE:    Name: [name] Date: 02/28/2023 MRN: [id number] DOB: 1990-03-09 PCP: Myrlene Broker, MD   Time spent: 10:01 AM - 10:58 AM  In session today I met with [name] for in office face-to-face in person individual psychotherapy.    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  He states that his stress was creating physical/medical problems and in his relationships.  [name] broke up with his girlfriend in last November and they have been talking on and off.  He is hopeful that they will get back together again but he's afraid to ask if she does.    His parents divorced three years ago.  They waited until their boys were out of school.  Older sister [name] (31) lives in [place] and has three kids who are in [address].  His sister is divorced but in a long term relationship.  Younger brother, [name] lives in [place] and works for [employer] and is in it as a Event organiser.  [name] came out as bi-sexual and his father is not on board.  [name] relationship with his dad got better once he went to college.  Growing up he was closer to his mom, but after the divorce he has a lot of resentment towards her.  She is constantly cutting his father down when he is with her.     Mental Status Exam: Appearance:   Fairly Groomed     Behavior:  Appropriate  Motor:  Normal  Speech/Language:   NA  Affect:  Depressed  Mood:  normal  Thought process:  normal  Thought content:    WNL  Sensory/Perceptual disturbances:    WNL  Orientation:  oriented to person, place, time/date, and situation  Attention:  NA  Concentration:  Good  Memory:  WNL  Fund of knowledge:   Good  Insight:    Good  Judgment:   Good  Impulse Control:  Good    Risk Assessment: Danger to Self:  No Self-injurious Behavior: No Danger to Others: No Duty to Warn:no Physical Aggression / Violence:No  Access to Firearms a concern: No   Gang Involvement:No    Substance Abuse History: Current substance abuse: No , [name] - alcoholic     Past Psychiatric History:   No previous psychological problems have been observed Outpatient Providers:  History of Psych Hospitalization: No  Psychological Testing:  n/a     Abuse History:  Victim of: No.,  n/a    Report needed: No. Victim of Neglect:No. Perpetrator of  /a   Witness / Exposure to Domestic Violence:  n/a   Management consultant Involvement:  n/a Witness to [employer] Violence:   n/a   Family History:       Family History  Problem Relation Age of Onset   Hypertension Father     Pancreatic cancer Maternal Grandfather     Diabetes Paternal Grandmother     Stomach cancer Neg Hx     Esophageal cancer Neg Hx     Colon cancer Neg Hx            Living situation: the patient lives alone   Sexual Orientation: Straight   Relationship Status: single  Name of spouse / other: n/a If a parent, number of children / ages:    Financial Stress:  No    Income/Employment/Disability: Employment Works in Ambulance person: No  Educational History: Education: some college   Religion/Sprituality/World View: N/a   Any cultural differences that may affect / interfere with treatment:  not applicable    Recreation/Hobbies: working out,    Stressors: Health problems   Other: work   Individualized Treatment Plan         Strengths: bright, verbal, Recruitment consultant, resourceful  Supports: family, supportive friendships   Goal/Needs for Treatment:  In order of importance to patient 1) Learn and implement skills and strategies to reduce stress 2) Learn and implement skills and strategies to reduce anxiety 3) Learn and implement skills and strategies to reduce depression 4) Learn and implement skills and strategies to better manage ADHD   Client Statement of Needs: better understanding of how to mange stress, learn to be more mindful, learn how  to handle problems instead of panicking   Treatment Level: Weekly Outpatient Individual Psychotherapy  Symptoms: feeling down, fatigue, constant worry, episodes of panic,   Client Treatment Preferences: none   Healthcare consumer's goal for treatment:  Psychologist, Hilma Favors, Ph.D. will support the patient's ability to achieve the goals identified. Cognitive Behavioral Therapy, Dialectical Behavioral Therapy, Motivational Interviewing, behavior activation and other evidenced-based practices will be used to promote progress towards healthy functioning.   Healthcare consumer [name] will: Actively participate in therapy, working towards healthy functioning.    *Justification for Continuation/Discontinuation of Goal: R=Revised, O=Ongoing, A=Achieved, D=Discontinued  Goal 1) Learn and implement skills and strategies to reduce stress  5 Point Likert rating baseline date: 12/14/21 Target Date Goal Was reviewed Status Code Progress towards goal/Likert rating  12/15/22 12/30/2022         O 3/5 - pt has learn skills but is inconsistent in applying them to all situations  12/30/2023          O         Goal 2) Learn and implement skills and strategies to reduce anxiety  5 Point Likert rating baseline date: 12/14/21 Target Date Goal Was reviewed Status Code Progress towards goal/Likert rating  12/15/22 12/30/2022          O 3/5 - pt has learn skills but is inconsistent in applying them to all situations  12/30/2023           O         Goal 3) Learn and implement skills and strategies to reduce depression  5 Point Likert rating baseline date: 12/14/21 Target Date Goal Was reviewed Status Code Progress towards goal/Likert rating  12/15/22 12/30/2022          O 3/5 - pt has learn skills but is inconsistent in applying them to all situations  12/30/2023           O         Goal 4) Learn and implement skills and strategies to better manage ADHD  5 Point Likert rating baseline  date: 12/14/21 Target Date Goal Was reviewed Status Code Progress towards goal/Likert rating   12/30/2023            N   12/30/2023           O           This plan has been reviewed and created by the following participants:  This plan will be reviewed at least every 12 months. Date Behavioral Health Clinician Date Guardian/Patient   12/14/2021  Hilma Favors, Ph.D.   12/14/2021 [name]  12/30/2022  Hilma Favors, Ph.D.  12/30/2022 [name]  Diagnoses:  Major Depressive Disorder, single episode, mild Phase of Life Problem Attention Deficit Disorder,    Jovel reports that he vacationed with [name] family over the holiday and things went very well.  He shared that unfortunately her holiday work party did not go as well.  He shared another situation that had me wondering with him about the differences in their temperaments.  I provided psycho education about temperament, introverts vs extroverts in relationships, in relation to the challenges he is encountering in his relationship.  The concept of temperament struck a chord with [name] and I shared a few books they might find helpful.   Hilma Favors, PhD

## 2023-04-04 ENCOUNTER — Ambulatory Visit: Payer: BC Managed Care – PPO | Admitting: Psychology

## 2023-04-04 DIAGNOSIS — F320 Major depressive disorder, single episode, mild: Secondary | ICD-10-CM | POA: Diagnosis not present

## 2023-04-04 DIAGNOSIS — F988 Other specified behavioral and emotional disorders with onset usually occurring in childhood and adolescence: Secondary | ICD-10-CM | POA: Diagnosis not present

## 2023-04-04 DIAGNOSIS — F411 Generalized anxiety disorder: Secondary | ICD-10-CM

## 2023-04-04 NOTE — Progress Notes (Signed)
PROGRESS NOTE:    Name: [name] Date: 04/04/2023 MRN: [id number] DOB: 21-May-1990 PCP: Myrlene Broker, MD   Time spent: 10:01 AM - 10:58 AM   Today I met with  [name] in remote video ([facility]) face-to-face individual psychotherapy.  Distance Site: Client's Home Orginating Site: Dr Odette Horns Remote Office Consent: Obtained verbal consent to transmit session remotely. Patient is aware of the inherent limitations in participating in virtual therapy.   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  He states that his stress was creating physical/medical problems and in his relationships.  [place] broke up with his girlfriend in last November and they have been talking on and off.  He is hopeful that they will get back together again but he's afraid to ask if she does.    His parents divorced three years ago.  They waited until their boys were out of school.  Older sister [name] (25) lives in [place] and has three kids who are in [address].  His sister is divorced but in a long term relationship.  Younger brother, [name] lives in [place] and works for [employer] and is in it as a Event organiser.  [name] came out as bi-sexual and his father is not on board.  [name]'s relationship with his dad got better once he went to college.  Growing up he was closer to his mom, but after the divorce he has a lot of resentment towards her.  She is constantly cutting his father down when he is with her.     Mental Status Exam: Appearance:   Fairly Groomed     Behavior:  Appropriate  Motor:  Normal  Speech/Language:   NA  Affect:  Depressed  Mood:  normal  Thought process:  normal  Thought content:    WNL  Sensory/Perceptual disturbances:    WNL  Orientation:  oriented to person, place, time/date, and situation  Attention:  NA  Concentration:  Good  Memory:  WNL  Fund of knowledge:   Good  Insight:    Good  Judgment:    Good  Impulse Control:  Good    Risk Assessment: Danger to Self:  No Self-injurious Behavior: No Danger to Others: No Duty to Warn:no Physical Aggression / Violence:No  Access to Firearms a concern: No  Gang Involvement:No    Substance Abuse History: Current substance abuse: No , PaGF - alcoholic     Past Psychiatric History:   No previous psychological problems have been observed Outpatient Providers:  History of Psych Hospitalization: No  Psychological Testing:  n/a     Abuse History:  Victim of: No.,  n/a    Report needed: No. Victim of Neglect:No. Perpetrator of  /a   Witness / Exposure to Domestic Violence:  n/a   Management consultant Involvement:  n/a Witness to [employer] Violence:   n/a   Family History:       Family History  Problem Relation Age of Onset   Hypertension Father     Pancreatic cancer Maternal Grandfather     Diabetes Paternal Grandmother     Stomach cancer Neg Hx     Esophageal cancer Neg Hx     Colon cancer Neg Hx            Living situation: the patient lives alone   Sexual Orientation: Straight   Relationship Status: single  Name of spouse / other: n/a If a  parent, number of children / ages:    Financial Stress:  No    Income/Employment/Disability: Employment Works in Ambulance person: No    Educational History: Education: some college   Oncologist: N/a   Any cultural differences that may affect / interfere with treatment:  not applicable    Recreation/Hobbies: working out,    Stressors: Health problems   Other: work   Individualized Treatment Plan         Strengths: bright, verbal, Recruitment consultant, resourceful  Supports: family, supportive friendships   Goal/Needs for Treatment:  In order of importance to patient 1) Learn and implement skills and strategies to reduce stress 2) Learn and implement skills and strategies to reduce anxiety 3) Learn and implement skills and  strategies to reduce depression 4) Learn and implement skills and strategies to better manage ADHD   Client Statement of Needs: better understanding of how to mange stress, learn to be more mindful, learn how to handle problems instead of panicking   Treatment Level: Weekly Outpatient Individual Psychotherapy  Symptoms: feeling down, fatigue, constant worry, episodes of panic,   Client Treatment Preferences: none   Healthcare consumer's goal for treatment:  Psychologist, Hilma Favors, Ph.D. will support the patient's ability to achieve the goals identified. Cognitive Behavioral Therapy, Dialectical Behavioral Therapy, Motivational Interviewing, behavior activation and other evidenced-based practices will be used to promote progress towards healthy functioning.   Healthcare consumer [name] will: Actively participate in therapy, working towards healthy functioning.    *Justification for Continuation/Discontinuation of Goal: R=Revised, O=Ongoing, A=Achieved, D=Discontinued  Goal 1) Learn and implement skills and strategies to reduce stress  5 Point Likert rating baseline date: 12/14/21 Target Date Goal Was reviewed Status Code Progress towards goal/Likert rating  12/15/22 12/30/2022         O 3/5 - pt has learn skills but is inconsistent in applying them to all situations  12/30/2023          O         Goal 2) Learn and implement skills and strategies to reduce anxiety  5 Point Likert rating baseline date: 12/14/21 Target Date Goal Was reviewed Status Code Progress towards goal/Likert rating  12/15/22 12/30/2022          O 3/5 - pt has learn skills but is inconsistent in applying them to all situations  12/30/2023           O         Goal 3) Learn and implement skills and strategies to reduce depression  5 Point Likert rating baseline date: 12/14/21 Target Date Goal Was reviewed Status Code Progress towards goal/Likert rating  12/15/22 12/30/2022          O 3/5 - pt has  learn skills but is inconsistent in applying them to all situations  12/30/2023           O         Goal 4) Learn and implement skills and strategies to better manage ADHD  5 Point Likert rating baseline date: 12/14/21 Target Date Goal Was reviewed Status Code Progress towards goal/Likert rating   12/30/2023            N   12/30/2023           O           This plan has been reviewed and created by the following participants:  This plan will be reviewed at least every 12  months. Date Behavioral Health Clinician Date Guardian/Patient   12/14/2021  Hilma Favors, Ph.D.   12/14/2021 [name]  12/30/2022  Hilma Favors, Ph.D.  12/30/2022 [name]              Diagnoses:  Major Depressive Disorder, single episode, mild Phase of Life Problem Attention Deficit Disorder,    [name] reports that he interviewed for a job in [place] and he was offered the job.  Things are moved really fast and he's anxious.  We d/e/p how things went down, his plans for the move, and concerns about finances.  I made some inquires which helped [place] to identify and d/p his particular anxieties related to the change of status in the relationship and communication problems they were having.       Hilma Favors, PhD  Girlfriend:

## 2023-05-02 ENCOUNTER — Ambulatory Visit: Payer: BC Managed Care – PPO | Admitting: Psychology

## 2023-05-30 ENCOUNTER — Ambulatory Visit: Payer: BC Managed Care – PPO | Admitting: Psychology

## 2023-06-27 ENCOUNTER — Ambulatory Visit: Payer: BC Managed Care – PPO | Admitting: Psychology
